# Patient Record
Sex: Female | Born: 1985
Health system: Southern US, Community
[De-identification: ages and names within clinical notes are randomized; demographics above are authoritative.]

## PROBLEM LIST (undated history)

## (undated) DIAGNOSIS — R51 Headache: Secondary | ICD-10-CM

## (undated) DIAGNOSIS — R109 Unspecified abdominal pain: Secondary | ICD-10-CM

## (undated) DIAGNOSIS — J45909 Unspecified asthma, uncomplicated: Secondary | ICD-10-CM

## (undated) DIAGNOSIS — R011 Cardiac murmur, unspecified: Secondary | ICD-10-CM

## (undated) DIAGNOSIS — R519 Headache, unspecified: Secondary | ICD-10-CM

## (undated) HISTORY — PX: TONSILLECTOMY: SUR1361

---
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
## 2006-10-25 ENCOUNTER — Ambulatory Visit: Payer: Self-pay | Admitting: Orthopedic Surgery

## 2007-02-15 ENCOUNTER — Emergency Department: Payer: Self-pay

## 2007-04-11 ENCOUNTER — Ambulatory Visit: Payer: Self-pay

## 2007-04-12 ENCOUNTER — Emergency Department: Payer: Self-pay | Admitting: Emergency Medicine

## 2007-04-25 ENCOUNTER — Ambulatory Visit: Payer: Self-pay | Admitting: Unknown Physician Specialty

## 2007-06-23 ENCOUNTER — Emergency Department: Payer: Self-pay | Admitting: Emergency Medicine

## 2007-06-26 ENCOUNTER — Emergency Department: Payer: Self-pay | Admitting: Emergency Medicine

## 2007-07-02 ENCOUNTER — Emergency Department: Payer: Self-pay | Admitting: Emergency Medicine

## 2007-08-14 ENCOUNTER — Emergency Department: Payer: Self-pay | Admitting: Emergency Medicine

## 2007-12-18 ENCOUNTER — Ambulatory Visit: Payer: Self-pay | Admitting: Unknown Physician Specialty

## 2007-12-20 ENCOUNTER — Emergency Department: Payer: Self-pay | Admitting: Unknown Physician Specialty

## 2008-04-19 ENCOUNTER — Ambulatory Visit: Payer: Self-pay | Admitting: Family Medicine

## 2008-06-17 ENCOUNTER — Emergency Department: Payer: Self-pay | Admitting: Emergency Medicine

## 2008-07-14 ENCOUNTER — Emergency Department: Payer: Self-pay | Admitting: Emergency Medicine

## 2008-11-11 ENCOUNTER — Observation Stay: Payer: Self-pay

## 2008-11-27 ENCOUNTER — Encounter: Payer: Self-pay | Admitting: Obstetrics and Gynecology

## 2008-11-27 ENCOUNTER — Ambulatory Visit: Payer: Self-pay | Admitting: Cardiology

## 2008-12-05 ENCOUNTER — Observation Stay: Payer: Self-pay | Admitting: Unknown Physician Specialty

## 2008-12-09 ENCOUNTER — Encounter: Payer: Self-pay | Admitting: Pediatric Cardiology

## 2009-01-12 ENCOUNTER — Observation Stay: Payer: Self-pay

## 2009-02-04 ENCOUNTER — Inpatient Hospital Stay: Payer: Self-pay

## 2009-05-11 ENCOUNTER — Emergency Department: Payer: Self-pay | Admitting: Emergency Medicine

## 2009-08-23 ENCOUNTER — Emergency Department: Payer: Self-pay | Admitting: Emergency Medicine

## 2010-08-31 ENCOUNTER — Emergency Department: Payer: Self-pay | Admitting: Emergency Medicine

## 2010-12-13 ENCOUNTER — Encounter: Payer: Self-pay | Admitting: Obstetrics and Gynecology

## 2011-01-25 ENCOUNTER — Encounter: Payer: Self-pay | Admitting: Pediatric Cardiology

## 2011-03-16 ENCOUNTER — Observation Stay: Payer: Self-pay

## 2011-04-27 ENCOUNTER — Observation Stay: Payer: Self-pay

## 2011-05-16 ENCOUNTER — Inpatient Hospital Stay: Payer: Self-pay

## 2011-06-03 ENCOUNTER — Ambulatory Visit: Payer: Self-pay

## 2012-06-15 ENCOUNTER — Ambulatory Visit: Payer: Self-pay | Admitting: Internal Medicine

## 2012-09-12 ENCOUNTER — Ambulatory Visit: Payer: Self-pay | Admitting: Family Medicine

## 2012-09-12 LAB — URINALYSIS, COMPLETE
Bilirubin,UR: NEGATIVE
Glucose,UR: NEGATIVE mg/dL (ref 0–75)
Leukocyte Esterase: NEGATIVE
Ph: 6 (ref 4.5–8.0)

## 2012-09-12 LAB — COMPREHENSIVE METABOLIC PANEL
Albumin: 4.1 g/dL (ref 3.4–5.0)
Anion Gap: 9 (ref 7–16)
BUN: 9 mg/dL (ref 7–18)
Bilirubin,Total: 0.4 mg/dL (ref 0.2–1.0)
Chloride: 105 mmol/L (ref 98–107)
Co2: 30 mmol/L (ref 21–32)
Creatinine: 1 mg/dL (ref 0.60–1.30)
EGFR (African American): >60
Glucose: 68 mg/dL (ref 65–99)
Osmolality: 284 (ref 275–301)
Potassium: 3.8 mmol/L (ref 3.5–5.1)
SGOT(AST): 14 U/L — ABNORMAL LOW (ref 15–37)
SGPT (ALT): 21 U/L (ref 12–78)
Sodium: 144 mmol/L (ref 136–145)
Total Protein: 7.5 g/dL (ref 6.4–8.2)

## 2012-09-12 LAB — CBC WITH DIFFERENTIAL/PLATELET
Basophil #: 0 10*3/uL (ref 0.0–0.1)
Basophil %: 0.4 %
Eosinophil #: 0.2 10*3/uL (ref 0.0–0.7)
Eosinophil %: 2.9 %
HCT: 42.7 % (ref 35.0–47.0)
HGB: 14.4 g/dL (ref 12.0–16.0)
Lymphocyte #: 2.5 10*3/uL (ref 1.0–3.6)
MCH: 30 pg (ref 26.0–34.0)
MCHC: 33.7 g/dL (ref 32.0–36.0)
MCV: 89 fL (ref 80–100)
Monocyte #: 0.3 x10 3/mm (ref 0.2–0.9)
Monocyte %: 4.6 %
Neutrophil %: 58 %
Platelet: 223 10*3/uL (ref 150–440)
RBC: 4.79 10*6/uL (ref 3.80–5.20)
RDW: 13.1 % (ref 11.5–14.5)
WBC: 7.4 10*3/uL (ref 3.6–11.0)

## 2012-09-12 LAB — LIPASE, BLOOD: Lipase: 232 U/L (ref 73–393)

## 2013-05-14 ENCOUNTER — Ambulatory Visit: Payer: Self-pay | Admitting: Emergency Medicine

## 2013-05-14 LAB — URINALYSIS, COMPLETE
Bilirubin,UR: NEGATIVE
Glucose,UR: NEGATIVE mg/dL (ref 0–75)
Ph: 6 (ref 4.5–8.0)
RBC,UR: NONE SEEN /HPF (ref 0–5)
Specific Gravity: 1.025 (ref 1.003–1.030)

## 2013-10-24 ENCOUNTER — Emergency Department: Payer: Self-pay | Admitting: Emergency Medicine

## 2013-10-24 LAB — CBC WITH DIFFERENTIAL/PLATELET
Basophil #: 0.1 10*3/uL (ref 0.0–0.1)
Basophil %: 0.6 %
EOS PCT: 1.6 %
Eosinophil #: 0.1 10*3/uL (ref 0.0–0.7)
HCT: 42.6 % (ref 35.0–47.0)
HGB: 14.3 g/dL (ref 12.0–16.0)
Lymphocyte #: 3.1 10*3/uL (ref 1.0–3.6)
Lymphocyte %: 39.1 %
MCH: 29.9 pg (ref 26.0–34.0)
MCHC: 33.4 g/dL (ref 32.0–36.0)
MCV: 90 fL (ref 80–100)
Monocyte #: 0.4 x10 3/mm (ref 0.2–0.9)
Monocyte %: 5 %
NEUTROS PCT: 53.7 %
Neutrophil #: 4.3 10*3/uL (ref 1.4–6.5)
Platelet: 243 10*3/uL (ref 150–440)
RBC: 4.76 10*6/uL (ref 3.80–5.20)
RDW: 12.6 % (ref 11.5–14.5)
WBC: 8.1 10*3/uL (ref 3.6–11.0)

## 2013-10-24 LAB — BASIC METABOLIC PANEL
Anion Gap: 3 — ABNORMAL LOW (ref 7–16)
BUN: 8 mg/dL (ref 7–18)
Calcium, Total: 8.6 mg/dL (ref 8.5–10.1)
Chloride: 108 mmol/L — ABNORMAL HIGH (ref 98–107)
Co2: 28 mmol/L (ref 21–32)
Creatinine: 0.6 mg/dL (ref 0.60–1.30)
EGFR (African American): >60
EGFR (Non-African Amer.): >60
Glucose: 94 mg/dL (ref 65–99)
OSMOLALITY: 276 (ref 275–301)
Potassium: 3.9 mmol/L (ref 3.5–5.1)
SODIUM: 139 mmol/L (ref 136–145)

## 2013-11-05 ENCOUNTER — Ambulatory Visit: Payer: Self-pay | Admitting: Neurology

## 2013-12-05 DIAGNOSIS — E538 Deficiency of other specified B group vitamins: Secondary | ICD-10-CM | POA: Insufficient documentation

## 2013-12-05 DIAGNOSIS — F329 Major depressive disorder, single episode, unspecified: Secondary | ICD-10-CM | POA: Insufficient documentation

## 2013-12-05 DIAGNOSIS — Z6841 Body Mass Index (BMI) 40.0 and over, adult: Secondary | ICD-10-CM | POA: Insufficient documentation

## 2013-12-05 DIAGNOSIS — E669 Obesity, unspecified: Secondary | ICD-10-CM | POA: Insufficient documentation

## 2013-12-05 DIAGNOSIS — F419 Anxiety disorder, unspecified: Secondary | ICD-10-CM | POA: Insufficient documentation

## 2013-12-05 DIAGNOSIS — F32A Depression, unspecified: Secondary | ICD-10-CM | POA: Insufficient documentation

## 2013-12-05 DIAGNOSIS — M542 Cervicalgia: Secondary | ICD-10-CM | POA: Insufficient documentation

## 2014-02-23 ENCOUNTER — Ambulatory Visit: Payer: Self-pay

## 2014-07-05 ENCOUNTER — Ambulatory Visit: Payer: Self-pay | Admitting: Physician Assistant

## 2014-09-07 ENCOUNTER — Ambulatory Visit: Payer: Self-pay | Admitting: Family Medicine

## 2016-01-17 ENCOUNTER — Ambulatory Visit
Admission: EM | Admit: 2016-01-17 | Discharge: 2016-01-17 | Disposition: A | Discharge status: Home / Self Care | Payer: Medicaid Other | Attending: Family Medicine | Admitting: Family Medicine

## 2016-01-17 DIAGNOSIS — H65192 Other acute nonsuppurative otitis media, left ear: Secondary | ICD-10-CM | POA: Diagnosis not present

## 2016-01-17 DIAGNOSIS — J069 Acute upper respiratory infection, unspecified: Secondary | ICD-10-CM

## 2016-01-17 MED ORDER — AMOXICILLIN 500 MG PO CAPS: 500.0000 mg | ORAL_CAPSULE | Freq: Two times a day (BID) | ORAL | Status: DC

## 2016-01-17 NOTE — ED Notes (Signed)
Patient complains of sore throat, fatigue, cough-non productive, ear pain and body aches x 1 week.

## 2016-01-17 NOTE — Discharge Instructions (Signed)
Patient can take oral antihistamine when necessary, cough suppressant when necessary, Tylenol/Motrin when necessary. Seek medical attention if symptoms persist or worsen.

## 2016-01-17 NOTE — ED Provider Notes (Signed)
CSN: [id number]     Arrival date & time 01/17/16  1534 History   First MD Initiated Contact with Patient 01/17/16 1617     Chief Complaint  Patient presents with  . Sore Throat   (Consider location/radiation/quality/duration/timing/severity/associated sxs/prior Treatment) HPI: Patient presents today with symptoms of sore throat, mild productive cough, left ear pain, myalgias for the last week. Patient denies any chest pain, shortness of breath, nausea, vomiting, diarrhea, abdominal pain. She denies smoking. She denies any asthma or COPD.  History reviewed. No pertinent past medical history. Past Surgical History  Procedure Laterality Date  . Tonsillectomy  age 30   Family History  Problem Relation Age of Onset  . Cardiomyopathy Father   . Migraines Mother    Social History  Substance Use Topics  . Smoking status: Never Smoker   . Smokeless tobacco: None  . Alcohol Use: 0.0 oz/week    0 Standard drinks or equivalent per week     Comment: rarely   OB History    No data available     Review of Systems: Negative except mentioned above.  Allergies  Azithromycin and Topamax  Home Medications   Prior to Admission medications   Medication Sig Start Date End Date Taking? Authorizing Provider  norethindrone-ethinyl estradiol (JUNEL FE,GILDESS FE,LOESTRIN FE) 1-20 MG-MCG tablet Take 1 tablet by mouth daily.   Yes Historical Provider, MD  amoxicillin (AMOXIL) 500 MG capsule Take 1 capsule (500 mg total) by mouth 2 (two) times daily. 01/17/16   Jolene ProvostKirtida Aiyden Lauderback, MD   Meds Ordered and Administered this Visit  Medications - No data to display  BP 128/84 mmHg  Pulse 85  Temp(Src) 98.4 F (36.9 C) (Oral)  Resp 18  Ht 5\' 2"  (1.575 m)  Wt 230 lb (104.327 kg)  BMI 42.06 kg/m2  SpO2 98%  LMP 01/03/2016 No data found.   Physical Exam:  GENERAL: NAD HEENT: mild pharyngeal erythema, no exudate, moderate erythema of left TM, no bulging or discharge from ear, normal right TM, no  cervical LAD RESP: CTA B CARD: RRR NEURO: CN II-XII grossly intact   ED Course  Procedures (including critical care time)  Labs Review Labs Reviewed - No data to display  Imaging Review No results found.    MDM   1. Other acute nonsuppurative otitis media of left ear   2. URI, acute   Patient given prescription for Amoxicillin, Claritin when necessary, Sudafed when necessary, Delsym when necessary, rest, hydration, Tylenol/Motrin when necessary, seek medical attention if symptoms persist or worsen as discussed.    Jolene ProvostKirtida Adina Puzzo, MD 01/17/16 1630

## 2016-08-31 DIAGNOSIS — M5416 Radiculopathy, lumbar region: Secondary | ICD-10-CM | POA: Insufficient documentation

## 2016-10-24 ENCOUNTER — Emergency Department
Admission: EM | Admit: 2016-10-24 | Discharge: 2016-10-24 | Disposition: A | Discharge status: Home / Self Care | Payer: Medicaid Other | Attending: Emergency Medicine | Admitting: Emergency Medicine

## 2016-10-24 ENCOUNTER — Encounter: Payer: Self-pay | Admitting: *Deleted

## 2016-10-24 DIAGNOSIS — M5431 Sciatica, right side: Secondary | ICD-10-CM | POA: Insufficient documentation

## 2016-10-24 LAB — URINALYSIS, COMPLETE (UACMP) WITH MICROSCOPIC
BACTERIA UA: NONE SEEN
BILIRUBIN URINE: NEGATIVE
Glucose, UA: NEGATIVE mg/dL
HGB URINE DIPSTICK: NEGATIVE
Ketones, ur: NEGATIVE mg/dL
Leukocytes, UA: NEGATIVE
NITRITE: NEGATIVE
PROTEIN: NEGATIVE mg/dL
Specific Gravity, Urine: 1.019 (ref 1.005–1.030)
pH: 6 (ref 5.0–8.0)

## 2016-10-24 LAB — POCT PREGNANCY, URINE: PREG TEST UR: NEGATIVE

## 2016-10-24 MED ORDER — PREDNISONE 10 MG PO TABS: ORAL_TABLET | ORAL | 0 refills | Status: DC

## 2016-10-24 NOTE — ED Notes (Signed)
See triage note  States she has had lower back pain for several weeks  States pain radiates into right leg  Denies any recent trauma.Marland Kitchenambulates with slight limp

## 2016-10-24 NOTE — ED Triage Notes (Signed)
States lower back pain that goes down her right leg, states pain since February 3rd, states she has been seen at [facility] and [facility] with no relief, denies any urinary symptoms

## 2016-10-24 NOTE — ED Provider Notes (Signed)
[facility] Emergency Department Provider Note   ____________________________________________   None    (approximate)  I have reviewed the triage vital signs and the nursing notes.   HISTORY  Chief Complaint Back Pain   HPI Torri Karin Lieu is a 31 y.o. female for a complaint of low back pain that radiates down her right leg. Patient states that she has had pain since February 3. She has been to both [facility] and [facility]  for examination of her back and has not had any relief. Patient denies any urinary symptoms, incontinence of bowel or bladder, no saddle anesthesias. Patient continues to ambulate without assistance. Patient denies any injury to her back. Patient was seen at [facility] and reportedly referred to the "spine doctor" and a "chiropractor". She states she is not currently taking any medication but was given Toradol injection without any relief. She states that her back was x-rayed at [facility] in February and she was told that this was normal. She does admit on this visit that she has had some frequent urination and a history of UTIs in the past. She denies any fever or chills. There's been no nausea or vomiting. At this time she rates her pain as a 10 over 10.   History reviewed. No pertinent past medical history.  There are no active problems to display for this patient.   Past Surgical History:  Procedure Laterality Date  . TONSILLECTOMY  age 49    Prior to Admission medications   Medication Sig Start Date End Date Taking? Authorizing Provider  norethindrone-ethinyl estradiol (JUNEL FE,GILDESS FE,LOESTRIN FE) 1-20 MG-MCG tablet Take 1 tablet by mouth daily.    Historical Provider, MD  predniSONE (DELTASONE) 10 MG tablet Take 6 tablets  today, on day 2 take 5 tablets, day 3 take 4 tablets, day 4 take 3 tablets, day 5 take  2 tablets and 1 tablet the last day 10/24/16   Tommi Rumps, PA-C    Allergies Azithromycin and Topamax [topiramate]  Family  History  Problem Relation Age of Onset  . Cardiomyopathy Father   . Migraines Mother     Social History Social History  Substance Use Topics  . Smoking status: Never Smoker  . Smokeless tobacco: Not on file  . Alcohol use 0.0 oz/week     Comment: rarely    Review of Systems Constitutional: No fever/chills Cardiovascular: Denies chest pain. Respiratory: Denies shortness of breath. Gastrointestinal: No abdominal pain.  No nausea, no vomiting.  Genitourinary: Positive for frequency. Musculoskeletal: Positive for low back pain. Positive for radiculopathy right leg. Skin: Negative for rash. Neurological: Negative for  focal weakness or numbness.  10-point ROS otherwise negative.  ____________________________________________   PHYSICAL EXAM:  VITAL SIGNS: ED Triage Vitals  Enc Vitals Group     BP 10/24/16 0939 130/89     Pulse Rate 10/24/16 0939 81     Resp 10/24/16 0939 18     Temp 10/24/16 0939 98.9 F (37.2 C)     Temp Source 10/24/16 0939 Oral     SpO2 10/24/16 0939 100 %     Weight 10/24/16 0940 210 lb (95.3 kg)     Height 10/24/16 0940  (1.575 m)     Head Circumference --      Peak Flow --      Pain Score 10/24/16 0941 10     Pain Loc --      Pain Edu? --      Excl. in GC? --  Constitutional: Alert and oriented. Well appearing and in no acute distress. Eyes: Conjunctivae are normal. PERRL. EOMI. Head: Atraumatic. Nose: No congestion/rhinnorhea. Neck: No stridor.   Cardiovascular: Normal rate, regular rhythm. Grossly normal heart sounds.  Good peripheral circulation. Respiratory: Normal respiratory effort.  No retractions. Lungs CTAB. Gastrointestinal: Soft and nontender. No distention.  No CVA tenderness. Musculoskeletal:Examination of the back there is no gross deformity. There is minimal tenderness on palpation of the vertebral bodies lumbar spine. There is however moderate tenderness on palpation of the right paravertebral muscles and SI joint  area. Range of motion is slightly restricted secondary to patient's pain. Straight leg raises were negative. Reflexes were 1+ bilaterally. Good strength noted bilaterally. Neurologic:  Normal speech and language. No gross focal neurologic deficits are appreciated. No gait instability. Skin:  Skin is warm, dry and intact. No rash noted. Psychiatric: Mood and affect are normal. Speech and behavior are normal.  ____________________________________________   LABS (all labs ordered are listed, but only abnormal results are displayed)  Labs Reviewed  URINALYSIS, COMPLETE (UACMP) WITH MICROSCOPIC - Abnormal; Notable for the following:       Result Value   Color, Urine YELLOW (*)    APPearance HAZY (*)    Squamous Epithelial / LPF 6-30 (*)    All other components within normal limits  POC URINE PREG, ED  POCT PREGNANCY, URINE    PROCEDURES  Procedure(s) performed: None  Procedures  Critical Care performed: No  ____________________________________________   INITIAL IMPRESSION / ASSESSMENT AND PLAN / ED COURSE  Pertinent labs & imaging results that were available during my care of the patient were reviewed by me and considered in my medical decision making (see chart for details).  Urinalysis was obtained and was negative for UTI. Records were reviewed and lumbar spine x-ray was done at [facility] February 2018. Patient is encouraged to call her doctor at [facility] for further examination of her back and evaluation. She was given a prednisone 6 day taper to begin today. She is encouraged to use ice or heat to her back as needed for comfort.      ____________________________________________   FINAL CLINICAL IMPRESSION(S) / ED DIAGNOSES  Final diagnoses:  Sciatica of right side      NEW MEDICATIONS STARTED DURING THIS VISIT:  Discharge Medication List as of 10/24/2016  1:03 PM    START taking these medications   Details  predniSONE (DELTASONE) 10 MG tablet Take 6 tablets  today, on  day 2 take 5 tablets, day 3 take 4 tablets, day 4 take 3 tablets, day 5 take  2 tablets and 1 tablet the last day, Print         Note:  This document was prepared using Dragon voice recognition software and may include unintentional dictation errors.    Tommi Rumps, PA-C 10/24/16 1946    Jene Every, MD 10/25/16 [phone]

## 2016-10-24 NOTE — Discharge Instructions (Signed)
Follow-up with your doctor at [facility]. Call today to let them know the you not doing better. Begin taking prednisone as directed. Alternate between ice and heat to your  back.

## 2017-01-26 ENCOUNTER — Emergency Department
Admission: EM | Admit: 2017-01-26 | Discharge: 2017-01-26 | Disposition: A | Discharge status: Home / Self Care | Payer: Self-pay | Attending: Emergency Medicine | Admitting: Emergency Medicine

## 2017-01-26 ENCOUNTER — Encounter: Payer: Self-pay | Admitting: Emergency Medicine

## 2017-01-26 ENCOUNTER — Emergency Department: Payer: Self-pay

## 2017-01-26 DIAGNOSIS — M546 Pain in thoracic spine: Secondary | ICD-10-CM | POA: Insufficient documentation

## 2017-01-26 DIAGNOSIS — M549 Dorsalgia, unspecified: Secondary | ICD-10-CM

## 2017-01-26 MED ORDER — IBUPROFEN 600 MG PO TABS: 600.0000 mg | ORAL_TABLET | Freq: Four times a day (QID) | ORAL | 0 refills | Status: DC | PRN

## 2017-01-26 MED ORDER — CYCLOBENZAPRINE HCL 5 MG PO TABS: 5.0000 mg | ORAL_TABLET | Freq: Three times a day (TID) | ORAL | 0 refills | Status: AC | PRN
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
## 2017-08-29 NOTE — H&P (Signed)
[name] is a 32 y.o. female here for LAVH and bilateral salpingectomy and possible unilateral oophorectomy . [name] [name] a 32 y.o.femalewas referred by Referred by Dr Scherrie Bateman pain G2P2 ( svdx2).pt with a 2.5 yr h/o of bilateral lower pelvic pain that's comes on for 2 weeks at a time . Pain is sharp and stabbing The 2 weeks would revolve 1 week before cycle and during her cycle . She has been on continuous OCPs and now DepoProvera ( no menses) and no difference in pain . + dyspareunia during these painful times. + mother and GM  with endometriosis. GM maternal with breast cancer also    patient has a h/o migraines without aura and OCPs have not worsened her h/a in past   TVUS 08/25/17  Study Result   Result status: In process  Ut anteverted  Fibroid seen: post to endo=0.93cm  Endo=4.33mm  No FF seen in CDS's  LOV appears wnl  ROV simple cyst=1.4cm            Past Medical History:  has a past medical history of Abnormal Pap smear, Asthma without status asthmaticus, unspecified, Cardiac murmur, unspecified, Cervicalgia, Depression, unspecified, Exertional dyspnea, Former tobacco use, IBS (irritable bowel syndrome), and Migraine.  Past Surgical History:  has a past surgical history that includes Tonsillectomy and Knee arthroscopy (2004). Family History: family history includes Asthma in her sister; Breast cancer in her maternal grandmother; Cardiomyopathy (Abnormal function of the heart muscle) in her father; Coronary Artery Disease (Blocked arteries around heart) in her father; Depression in her mother; Diabetes type II in her paternal aunt and paternal grandmother. Social History:  reports that she quit smoking about 4 years ago. Her smoking use included cigarettes. She has a 3.00 pack-year smoking history. She has never used smokeless tobacco. She reports that she drinks about 0.6 - 1.8 oz of alcohol per week. She reports that she  does not use drugs. OB/GYN History:          OB History    Gravida  2   Para  2   Term      Preterm      AB      Living  2     SAB      TAB      Ectopic      Molar      Multiple      Live Births  2          Allergies: is allergic to topamax [topiramate] and zithromax [azithromycin]. Medications:  Current Outpatient Medications:  .  butalbital-acetaminophen-caff 50-300-40 mg Cap, Take by mouth every 6 (six) hours as needed., Disp: , Rfl:  .  medroxyPROGESTERone (DEPO-PROVERA) 150 mg/mL injection, Inject 150 mg into the muscle every 3 (three) months, Disp: , Rfl:   Review of Systems: General:                      No fatigue or weight loss Eyes:                           No vision changes Ears:                            No hearing difficulty Respiratory:                No cough or shortness of breath Pulmonary:  No asthma or shortness of breath Cardiovascular:           No chest pain, palpitations, dyspnea on exertion Gastrointestinal:          No abdominal bloating, chronic diarrhea, constipations, masses, pain or hematochezia Genitourinary:             No hematuria, dysuria, abnormal vaginal discharge, pelvic pain, Menometrorrhagia Lymphatic:                   No swollen lymph nodes Musculoskeletal:         No muscle weakness Neurologic:                  No extremity weakness, syncope, seizure disorder Psychiatric:                  No history of depression, delusions or suicidal/homicidal ideation    Exam:      Vitals:   08/25/17 1509  BP: 131/89  Pulse: 95    Body mass index is 43.9 kg/m.  WDWN white/  female in NAD   Lungs: CTA  CV : RRR without murmur   Breast: exam done in sitting and lying position : No dimpling or retraction, no dominant mass, no spontaneous discharge, no axillary adenopathy Neck:  no thyromegaly Abdomen: soft , no mass, normal active bowel sounds,  non-tender, no rebound tenderness Pelvic:  tanner stage 5 ,  External genitalia: vulva /labia no lesions Urethra: no prolapse Vagina: normal physiologic d/c, adequate room for LAVH  Cervix: no lesions, no cervical motion tenderness   Uterus: normal size shape and contour, non-tender Adnexa: no mass,  non-tender   Rectovaginal:   Impression:   The primary encounter diagnosis was Pelvic pain in female. A diagnosis of Dyspareunia, female was also pertinent to this visit.  Pain is c/w endometriosis given the cyclic nature and her fhx of endometriosis  Plan:  I have spoken with the patient regarding treatment options including expectant management, hormonal options, or surgical intervention.ie .Marland Kitchen. Diagnostic L/S or definitive LAVH+ bilateral salpingectomy and possible unilateral oophorectomy if endometriosis is found and / or involves an ovary . After a full discussion the pt elects to proceed with the latter  25 minutes spent with the patient and > 50 % of time spent in direct counseling  pt has been counseled regarding the risks of the procedure      Vilma PraderHOMAS JANSE SCHERMERHORN, MD

## 2017-08-31 ENCOUNTER — Encounter
Admission: RE | Admit: 2017-08-31 | Discharge: 2017-08-31 | Disposition: A | Discharge status: Home / Self Care | Payer: Medicaid Other | Source: Ambulatory Visit | Attending: Obstetrics and Gynecology | Admitting: Obstetrics and Gynecology

## 2017-08-31 ENCOUNTER — Ambulatory Visit
Admission: EM | Admit: 2017-08-31 | Discharge: 2017-08-31 | Disposition: A | Discharge status: Home / Self Care | Payer: Medicaid Other | Attending: Emergency Medicine | Admitting: Emergency Medicine

## 2017-08-31 ENCOUNTER — Other Ambulatory Visit: Payer: Self-pay

## 2017-08-31 DIAGNOSIS — R0981 Nasal congestion: Secondary | ICD-10-CM | POA: Diagnosis not present

## 2017-08-31 DIAGNOSIS — J029 Acute pharyngitis, unspecified: Secondary | ICD-10-CM

## 2017-08-31 DIAGNOSIS — H6693 Otitis media, unspecified, bilateral: Secondary | ICD-10-CM

## 2017-08-31 HISTORY — DX: Cardiac murmur, unspecified: R01.1

## 2017-08-31 HISTORY — DX: Headache: R51

## 2017-08-31 HISTORY — DX: Headache, unspecified: R51.9

## 2017-08-31 HISTORY — DX: Unspecified asthma, uncomplicated: J45.909

## 2017-08-31 LAB — RAPID STREP SCREEN (MED CTR MEBANE ONLY): Streptococcus, Group A Screen (Direct): NEGATIVE

## 2017-08-31 LAB — RAPID INFLUENZA A&B ANTIGENS
Influenza A (ARMC): NEGATIVE
Influenza B (ARMC): NEGATIVE

## 2017-08-31 MED ORDER — AMOXICILLIN 500 MG PO CAPS: 500.0000 mg | ORAL_CAPSULE | Freq: Two times a day (BID) | ORAL | 0 refills | Status: DC

## 2017-08-31 MED ORDER — CETIRIZINE HCL 10 MG PO TABS: 10.0000 mg | ORAL_TABLET | Freq: Every day | ORAL | 2 refills | Status: DC

## 2017-08-31 NOTE — Discharge Instructions (Signed)
-  Amoxicillin: one tablet twice a day for 10 days -Zyrtec: one tablet daily -ibuprofen or Tylenol for pain and fever -push fluids -strep and flu swabs are negative. Strep will be sent for culture -touch base with OBGYN regarding

## 2017-08-31 NOTE — Patient Instructions (Signed)
Your procedure is scheduled on: Monday, MARCH 4  Report to THE SECOND FLOOR OF THE MEDICAL MALL  To find out your arrival time please call [phone] between 1PM - 3PM on Friday, MARCH 1  Remember: Instructions that are not followed completely may result in serious  medical risk, up to and including death, or upon the discretion of your surgeon  and anesthesiologist your surgery may need to be rescheduled.     _X__ 1. Do not eat food after midnight the night before your procedure.                 No gum chewing, lozengers or hard candies.                  You may drink clear liquids up to 2 hours                 before you are scheduled to arrive for your surgery-                  DO not drink clear liquids within 2 hours of the start of your surgery.                  Clear Liquids include:  water, apple juice without pulp, clear carbohydrate                 drink such as Clearfast of Gartorade, Black Coffee or Tea (Do not add                 anything to coffee or tea).  __X__2.  On the morning of surgery brush your teeth with toothpaste and water,                       you may rinse your mouth with mouthwash if you wish.                              Do not swallow any toothpaste of mouthwash.     _X__ 3.  No Alcohol for 24 hours before or after surgery.   _X__ 4.  Do Not Smoke or use e-cigarettes For 24 Hours Prior to Your Surgery.                 Do not use any chewable tobacco products for at least 6 hours prior to                 surgery.  ____  5.  Bring all medications with you on the day of surgery if instructed.   __x__  6.  Notify your doctor if there is any change in your medical condition      (cold, fever, infections). IF YOUR EAR INFECTION HAS NOT IMPROVED                  OR YOU ARE STILL SICK, PLEASE CALL DR. SCHERMERHORN                     AND LET HIM KNOW.     Do not wear jewelry, make-up, hairpins, clips or nail polish. Do not wear  lotions, powders, or perfumes. You may wear deodorant. Do not shave 48 hours prior to surgery. Men may shave face and neck. Do not bring valuables to the hospital.    [facility] any belongings or valuables.  Contacts,  dentures or bridgework may not be worn into surgery. Leave your suitcase in the car. After surgery it may be brought to your room. For patients admitted to the hospital, discharge time is determined by your treatment team.   Patients discharged the day of surgery will not be allowed to drive home.   Please read over the following fact sheets that you were given:   INSTRUCTIONS PROVIDED OVER THE PHONE                     MEDICAL DIRECTIVE INFORMATION SHEET   ____ Take these medicines the morning of surgery with A SIP OF WATER:    1.NONE  2.   3.   4.    _X___ Fleet Enema (as directed) USE ONE HOUR BEFORE ARRIVING FOR SURGERY   __X__ Use CHG Soap as directed. INSTRUCTIONS PROVIDED  ____ Use inhalers on the day of surgery  ____ Stop ALL ASPIRIN PRODUCTS AS OF 09/04/17              THIS INCLUDES EXCEDRIN / GOODYS POWDER / BC POWDER  ____ Stop Anti-inflammatories AS OF 09/04/17               THIS INCLUDES IBUPROFEN / MOTRIN / ADVIL / ALEVE   __X__ Stop supplements until after surgery.  ALL SUPPLEMENTS STOP 09/04/17  ____ Bring C-Pap to the hospital.   HAVE STOOL SOFTENERS AVAILABLE ONCE HOME  WEAR LOOSE FITTING CLOTHING TO THE HOSPITAL.  NO CONTACTS.

## 2017-08-31 NOTE — ED Triage Notes (Signed)
Patient complains of sore throat that started yesterday morning. Patient reports that she has body aches and fatigue.

## 2017-08-31 NOTE — ED Provider Notes (Signed)
[facility]    CSN: [id number] Arrival date & time: 08/31/17  [id number]     History   Chief Complaint Chief Complaint  Patient presents with  . Sore Throat    HPI [name] [name] is a 32 y.o. female.   Patient is a 32 year old female who presents with complaint of sore throat that started yesterday morning and states this morning she is also experiencing some ear pain, congestion, and sinus pressure with some clear drainage.  Patient does report body aches, but no fever or chills.  She states that her daughter is on day 9 of antibiotics for strep throat.  Additionally patient Her nephew over the weekend and he was just diagnosed with strep throat and flu.  Patient denies any nausea.  She is not taking any over-the-counter medications for this.  She is not taking antihistamines.  Patient reports she is scheduled for hysterectomy in 2 weeks for endometriosis.  Patient denies any chest pain or shortness of breath.  She does have some abdominal discomfort that she attributes to her endometriosis.  Patient reports that she gets a rash with azithromycin as well as Topamax.      History reviewed. No pertinent past medical history.  There are no active problems to display for this patient.   Past Surgical History:  Procedure Laterality Date  . TONSILLECTOMY  age 32    OB History    No data available       Home Medications    Prior to Admission medications   Medication Sig Start Date End Date Taking? Authorizing Provider  aspirin-acetaminophen-caffeine (EXCEDRIN MIGRAINE) 385 172 [id number] MG tablet Take 2 tablets by mouth daily as needed for headache.   Yes [provider]  Carboxymethylcellul-Glycerin (LUBRICATING EYE DROPS OP) Apply 1 drop to eye daily as needed (dry eyes).   Yes [provider]  loperamide (IMODIUM) 2 MG capsule Take 2 mg by mouth as needed for diarrhea or loose stools.   Yes [provider]  medroxyPROGESTERone  (DEPO-PROVERA) 150 MG/ML injection Inject 150 mg into the muscle every 3 (three) months.   Yes [provider]  amoxicillin (AMOXIL) 500 MG capsule Take 1 capsule (500 mg total) by mouth 2 (two) times daily. 08/31/17   Candis SchatzHarris, Bethanne Mule D, PA-C  cetirizine (ZYRTEC) 10 MG tablet Take 1 tablet (10 mg total) by mouth daily. 08/31/17   Candis SchatzHarris, Delvis Kau D, PA-C    Family History Family History  Problem Relation Age of Onset  . Cardiomyopathy Father   . Migraines Mother     Social History Social History   Tobacco Use  . Smoking status: Never Smoker  . Smokeless tobacco: Never Used  Substance Use Topics  . Alcohol use: Yes    Alcohol/week: 0.0 oz    Comment: rarely  . Drug use: No     Allergies   Azithromycin and Topamax [topiramate]   Review of Systems Review of Systems  As noted above in HPI.  Other systems reviewed and found to be negative   Physical Exam Triage Vital Signs ED Triage Vitals  Enc Vitals Group     BP 08/31/17 0957 126/86     Pulse Rate 08/31/17 0957 65     Resp 08/31/17 0957 18     Temp 08/31/17 0957 98.4 F (36.9 C)     Temp Source 08/31/17 0957 Oral     SpO2 08/31/17 0957 98 %     Weight 08/31/17 0955 235 lb (106.6 kg)  Height 08/31/17 0955 5\' 2"  (1.575 m)     Head Circumference --      Peak Flow --      Pain Score 08/31/17 0955 3     Pain Loc --      Pain Edu? --      Excl. in GC? --    No data found.  Updated Vital Signs BP 126/86 (BP Location: Left Arm)   Pulse 65   Temp 98.4 F (36.9 C) (Oral)   Resp 18   Ht 5\' 2"  (1.575 m)   Wt 235 lb (106.6 kg)   SpO2 98%   BMI 42.98 kg/m    Physical Exam  Constitutional: She is oriented to person, place, and time. She appears well-developed and well-nourished. She does not appear ill.  HENT:  Head: Atraumatic.  Right Ear: Ear canal normal. A middle ear effusion (cloudy fluid) is present.  Left Ear: Ear canal normal. A middle ear effusion (cloudy fluid) is present.  Mouth/Throat:  Uvula is midline, oropharynx is clear and moist and mucous membranes are normal. No posterior oropharyngeal erythema. Tonsils are 0 on the right. Tonsils are 0 on the left. No tonsillar exudate.  Eyes: EOM are normal. Pupils are equal, round, and reactive to light.  Neck: Normal range of motion. Neck supple.  Cardiovascular: Normal rate, regular rhythm and normal heart sounds.  No murmur heard. Pulmonary/Chest: Effort normal and breath sounds normal. No respiratory distress. She has no wheezes.  Neurological: She is alert and oriented to person, place, and time.  Skin: Skin is warm and dry.  Psychiatric: She has a normal mood and affect. Her behavior is normal.     UC Treatments / Results  Labs (all labs ordered are listed, but only abnormal results are displayed) Labs Reviewed  RAPID STREP SCREEN (NOT AT [facility])  RAPID INFLUENZA A&B ANTIGENS ([facility] ONLY)  CULTURE, GROUP A STREP [facility])    EKG  EKG Interpretation None       Radiology No results found.  Procedures Procedures (including critical care time)  Medications Ordered in UC Medications - No data to display   Initial Impression / Assessment and Plan / UC Course  I have reviewed the triage vital signs and the nursing notes.  Pertinent labs & imaging results that were available during my care of the patient were reviewed by me and considered in my medical decision making (see chart for details).    Patient with upper restaurant symptoms as well as body aches that began last night.  Patient with contacts at home with both strep throat and flu.  Final Clinical Impressions(s) / UC Diagnoses   Final diagnoses:  Bilateral otitis media, unspecified otitis media type  Nasal congestion  Sore throat    ED Discharge Orders        Ordered    amoxicillin (AMOXIL) 500 MG capsule  2 times daily     08/31/17 1053    cetirizine (ZYRTEC) 10 MG tablet  Daily     08/31/17 1053     Strep and flu swabs were negative.  We will  send the strep for culture.  We will give her prescription for Zyrtec for her symptoms.  Also go ahead and give her prescription for amoxicillin for her otitis media given her impending surgery.  Advised patient to follow-up with her OB GYN/surgeon   Controlled Substance Prescriptions [place] Controlled Substance Registry consulted? Not Applicable   Candis Schatz, PA-C 08/31/17 1056

## 2017-09-03 LAB — CULTURE, GROUP A STREP (THRC)

## 2017-09-05 ENCOUNTER — Encounter
Admission: RE | Admit: 2017-09-05 | Discharge: 2017-09-05 | Disposition: A | Discharge status: Home / Self Care | Payer: Medicaid Other | Source: Ambulatory Visit | Attending: Obstetrics and Gynecology | Admitting: Obstetrics and Gynecology

## 2017-09-05 DIAGNOSIS — Z01812 Encounter for preprocedural laboratory examination: Secondary | ICD-10-CM | POA: Insufficient documentation

## 2017-09-05 DIAGNOSIS — Z01811 Encounter for preprocedural respiratory examination: Secondary | ICD-10-CM | POA: Diagnosis present

## 2017-09-05 LAB — TYPE AND SCREEN
ABO/RH(D): O POS
Antibody Screen: NEGATIVE

## 2017-09-05 LAB — BASIC METABOLIC PANEL
Anion gap: 10 (ref 5–15)
BUN: 8 mg/dL (ref 6–20)
CALCIUM: 9.3 mg/dL (ref 8.9–10.3)
CHLORIDE: 110 mmol/L (ref 101–111)
CO2: 22 mmol/L (ref 22–32)
CREATININE: 0.84 mg/dL (ref 0.44–1.00)
GFR calc non Af Amer: >60 mL/min (ref >60)
Glucose, Bld: 89 mg/dL (ref 65–99)
Potassium: 3.8 mmol/L (ref 3.5–5.1)
SODIUM: 142 mmol/L (ref 135–145)

## 2017-09-05 LAB — CBC
HCT: 42.4 % (ref 35.0–47.0)
Hemoglobin: 14 g/dL (ref 12.0–16.0)
MCH: 28.7 pg (ref 26.0–34.0)
MCHC: 32.9 g/dL (ref 32.0–36.0)
MCV: 87.2 fL (ref 80.0–100.0)
PLATELETS: 287 10*3/uL (ref 150–440)
RBC: 4.86 MIL/uL (ref 3.80–5.20)
RDW: 12.8 % (ref 11.5–14.5)
WBC: 5.7 10*3/uL (ref 3.6–11.0)

## 2017-09-11 ENCOUNTER — Other Ambulatory Visit: Payer: Self-pay

## 2017-09-11 ENCOUNTER — Observation Stay
Admission: RE | Admit: 2017-09-11 | Discharge: 2017-09-11 | Disposition: A | Discharge status: Home / Self Care | Payer: Medicaid Other | Source: Ambulatory Visit | Attending: Obstetrics and Gynecology | Admitting: Obstetrics and Gynecology

## 2017-09-11 ENCOUNTER — Ambulatory Visit: Payer: Medicaid Other | Admitting: Anesthesiology

## 2017-09-11 ENCOUNTER — Encounter: Payer: Self-pay | Admitting: *Deleted

## 2017-09-11 ENCOUNTER — Encounter
Admission: RE | Disposition: A | Discharge status: Home / Self Care | Payer: Self-pay | Source: Ambulatory Visit | Attending: Obstetrics and Gynecology

## 2017-09-11 DIAGNOSIS — Z793 Long term (current) use of hormonal contraceptives: Secondary | ICD-10-CM | POA: Diagnosis not present

## 2017-09-11 DIAGNOSIS — Z842 Family history of other diseases of the genitourinary system: Secondary | ICD-10-CM | POA: Diagnosis not present

## 2017-09-11 DIAGNOSIS — N800 Endometriosis of uterus: Principal | ICD-10-CM | POA: Insufficient documentation

## 2017-09-11 DIAGNOSIS — Z888 Allergy status to other drugs, medicaments and biological substances status: Secondary | ICD-10-CM | POA: Insufficient documentation

## 2017-09-11 DIAGNOSIS — Z79899 Other long term (current) drug therapy: Secondary | ICD-10-CM | POA: Insufficient documentation

## 2017-09-11 DIAGNOSIS — G8929 Other chronic pain: Secondary | ICD-10-CM | POA: Diagnosis present

## 2017-09-11 DIAGNOSIS — R102 Pelvic and perineal pain: Secondary | ICD-10-CM | POA: Diagnosis present

## 2017-09-11 DIAGNOSIS — Z7982 Long term (current) use of aspirin: Secondary | ICD-10-CM | POA: Diagnosis not present

## 2017-09-11 DIAGNOSIS — G43909 Migraine, unspecified, not intractable, without status migrainosus: Secondary | ICD-10-CM | POA: Insufficient documentation

## 2017-09-11 DIAGNOSIS — N803 Endometriosis of pelvic peritoneum: Secondary | ICD-10-CM | POA: Diagnosis not present

## 2017-09-11 DIAGNOSIS — J45909 Unspecified asthma, uncomplicated: Secondary | ICD-10-CM | POA: Insufficient documentation

## 2017-09-11 DIAGNOSIS — Z881 Allergy status to other antibiotic agents status: Secondary | ICD-10-CM | POA: Insufficient documentation

## 2017-09-11 DIAGNOSIS — Z87891 Personal history of nicotine dependence: Secondary | ICD-10-CM | POA: Insufficient documentation

## 2017-09-11 DIAGNOSIS — Z9889 Other specified postprocedural states: Secondary | ICD-10-CM

## 2017-09-11 HISTORY — PX: LAPAROSCOPIC VAGINAL HYSTERECTOMY WITH SALPINGECTOMY: SHX6680

## 2017-09-11 LAB — CBC
HCT: 42.3 % (ref 35.0–47.0)
HEMOGLOBIN: 13.9 g/dL (ref 12.0–16.0)
MCH: 28.4 pg (ref 26.0–34.0)
MCHC: 32.9 g/dL (ref 32.0–36.0)
MCV: 86.3 fL (ref 80.0–100.0)
PLATELETS: 281 10*3/uL (ref 150–440)
RBC: 4.9 MIL/uL (ref 3.80–5.20)
RDW: 13.1 % (ref 11.5–14.5)
WBC: 11.3 10*3/uL — ABNORMAL HIGH (ref 3.6–11.0)

## 2017-09-11 LAB — BASIC METABOLIC PANEL
Anion gap: 13 (ref 5–15)
BUN: 10 mg/dL (ref 6–20)
CHLORIDE: 108 mmol/L (ref 101–111)
CO2: 15 mmol/L — ABNORMAL LOW (ref 22–32)
CREATININE: 0.91 mg/dL (ref 0.44–1.00)
Calcium: 8.9 mg/dL (ref 8.9–10.3)
Glucose, Bld: 206 mg/dL — ABNORMAL HIGH (ref 65–99)
Potassium: 3.8 mmol/L (ref 3.5–5.1)
SODIUM: 136 mmol/L (ref 135–145)

## 2017-09-11 LAB — POCT PREGNANCY, URINE: Preg Test, Ur: NEGATIVE

## 2017-09-11 LAB — ABO/RH: ABO/RH(D): O POS

## 2017-09-11 SURGERY — HYSTERECTOMY, VAGINAL, LAPAROSCOPY-ASSISTED, WITH SALPINGECTOMY
Anesthesia: General | Wound class: Clean Contaminated

## 2017-09-11 MED ORDER — FENTANYL CITRATE (PF) 100 MCG/2ML IJ SOLN
50.0000 ug | Freq: Once | INTRAMUSCULAR | Status: AC
  Administered 2017-09-11: 50 ug via INTRAVENOUS

## 2017-09-11 MED ORDER — MORPHINE SULFATE (PF) 2 MG/ML IV SOLN
1.0000 mg | INTRAVENOUS | Status: DC | PRN
  Administered 2017-09-11: 2 mg via INTRAVENOUS
  Filled 2017-09-11: qty 1

## 2017-09-11 MED ORDER — FAMOTIDINE 20 MG PO TABS: 20.0000 mg | ORAL_TABLET | Freq: Once | ORAL | Status: DC

## 2017-09-11 MED ORDER — ROCURONIUM BROMIDE 50 MG/5ML IV SOLN
INTRAVENOUS | Status: AC
  Filled 2017-09-11: qty 1

## 2017-09-11 MED ORDER — LIDOCAINE-EPINEPHRINE 1 %-1:100000 IJ SOLN
INTRAMUSCULAR | Status: AC
  Filled 2017-09-11: qty 1

## 2017-09-11 MED ORDER — DEXAMETHASONE SODIUM PHOSPHATE 10 MG/ML IJ SOLN
INTRAMUSCULAR | Status: DC | PRN
  Administered 2017-09-11: 10 mg via INTRAVENOUS

## 2017-09-11 MED ORDER — FENTANYL CITRATE (PF) 100 MCG/2ML IJ SOLN
INTRAMUSCULAR | Status: AC
  Filled 2017-09-11: qty 2

## 2017-09-11 MED ORDER — OXYCODONE-ACETAMINOPHEN 5-325 MG PO TABS: 1.0000 | ORAL_TABLET | ORAL | 0 refills | Status: DC | PRN

## 2017-09-11 MED ORDER — LACTATED RINGERS IV SOLN: INTRAVENOUS | Status: DC

## 2017-09-11 MED ORDER — OXYCODONE HCL 5 MG/5ML PO SOLN: 5.0000 mg | Freq: Once | ORAL | Status: DC | PRN

## 2017-09-11 MED ORDER — OXYCODONE HCL 5 MG PO TABS: 5.0000 mg | ORAL_TABLET | Freq: Once | ORAL | Status: DC | PRN

## 2017-09-11 MED ORDER — MEPERIDINE HCL 50 MG/ML IJ SOLN: 6.2500 mg | INTRAMUSCULAR | Status: DC | PRN

## 2017-09-11 MED ORDER — FENTANYL CITRATE (PF) 100 MCG/2ML IJ SOLN: INTRAMUSCULAR | Status: DC | PRN

## 2017-09-11 MED ORDER — LIDOCAINE HCL (CARDIAC) 20 MG/ML IV SOLN
INTRAVENOUS | Status: DC | PRN
Start: 2017-09-11 — End: 2017-09-11

## 2017-09-11 MED ORDER — CEFAZOLIN SODIUM-DEXTROSE 2-4 GM/100ML-% IV SOLN
INTRAVENOUS | Status: AC
  Filled 2017-09-11: qty 100

## 2017-09-11 MED ORDER — ONDANSETRON HCL 4 MG/2ML IJ SOLN: 4.0000 mg | Freq: Four times a day (QID) | INTRAMUSCULAR | Status: DC | PRN

## 2017-09-11 MED ORDER — OXYCODONE-ACETAMINOPHEN 5-325 MG PO TABS
1.0000 | ORAL_TABLET | ORAL | Status: DC | PRN
  Administered 2017-09-11: 1 via ORAL
  Filled 2017-09-11 (×2): qty 1

## 2017-09-11 MED ORDER — LACTATED RINGERS IV SOLN
INTRAVENOUS | Status: DC | PRN
  Administered 2017-09-11: 12:00:00 via INTRAVENOUS

## 2017-09-11 MED ORDER — ROCURONIUM BROMIDE 100 MG/10ML IV SOLN
INTRAVENOUS | Status: DC | PRN
  Administered 2017-09-11: 50 mg via INTRAVENOUS

## 2017-09-11 MED ORDER — DOCUSATE SODIUM 100 MG PO CAPS: 100.0000 mg | ORAL_CAPSULE | Freq: Every day | ORAL | 2 refills | Status: DC | PRN

## 2017-09-11 MED ORDER — KETOROLAC TROMETHAMINE 30 MG/ML IJ SOLN
INTRAMUSCULAR | Status: DC | PRN
  Administered 2017-09-11: 30 mg via INTRAVENOUS

## 2017-09-11 MED ORDER — OXYCODONE-ACETAMINOPHEN 5-325 MG PO TABS
1.0000 | ORAL_TABLET | ORAL | Status: DC | PRN
  Administered 2017-09-11: 1 via ORAL

## 2017-09-11 MED ORDER — IBUPROFEN 800 MG PO TABS: 800.0000 mg | ORAL_TABLET | Freq: Three times a day (TID) | ORAL | 0 refills | Status: DC | PRN

## 2017-09-11 MED ORDER — FLEET ENEMA 7-19 GM/118ML RE ENEM: 1.0000 | ENEMA | Freq: Once | RECTAL | Status: DC

## 2017-09-11 MED ORDER — FAMOTIDINE 20 MG PO TABS
ORAL_TABLET | ORAL | Status: AC
  Filled 2017-09-11: qty 1

## 2017-09-11 MED ORDER — BUPIVACAINE HCL 0.5 % IJ SOLN
INTRAMUSCULAR | Status: DC | PRN
  Administered 2017-09-11: 15 mL

## 2017-09-11 MED ORDER — FENTANYL CITRATE (PF) 100 MCG/2ML IJ SOLN
INTRAMUSCULAR | Status: DC | PRN
  Administered 2017-09-11: 50 ug via INTRAVENOUS

## 2017-09-11 MED ORDER — FENTANYL CITRATE (PF) 100 MCG/2ML IJ SOLN
25.0000 ug | INTRAMUSCULAR | Status: AC | PRN
  Administered 2017-09-11 (×6): 25 ug via INTRAVENOUS

## 2017-09-11 MED ORDER — EPHEDRINE SULFATE 50 MG/ML IJ SOLN
INTRAMUSCULAR | Status: AC
  Filled 2017-09-11: qty 1

## 2017-09-11 MED ORDER — PROPOFOL 10 MG/ML IV BOLUS
INTRAVENOUS | Status: DC | PRN
  Administered 2017-09-11: 200 mg via INTRAVENOUS

## 2017-09-11 MED ORDER — FENTANYL CITRATE (PF) 100 MCG/2ML IJ SOLN
INTRAMUSCULAR | Status: AC
  Administered 2017-09-11: 25 ug via INTRAVENOUS
  Filled 2017-09-11: qty 2

## 2017-09-11 MED ORDER — ONDANSETRON HCL 4 MG/2ML IJ SOLN
INTRAMUSCULAR | Status: DC | PRN
  Administered 2017-09-11: 4 mg via INTRAVENOUS

## 2017-09-11 MED ORDER — ACETAMINOPHEN NICU IV SYRINGE 10 MG/ML
INTRAVENOUS | Status: AC
  Filled 2017-09-11: qty 1

## 2017-09-11 MED ORDER — PROPOFOL 10 MG/ML IV BOLUS
INTRAVENOUS | Status: AC
  Filled 2017-09-11: qty 20

## 2017-09-11 MED ORDER — ACETAMINOPHEN 10 MG/ML IV SOLN
INTRAVENOUS | Status: DC | PRN
  Administered 2017-09-11: 1000 mg via INTRAVENOUS

## 2017-09-11 MED ORDER — ONDANSETRON HCL 4 MG PO TABS: 4.0000 mg | ORAL_TABLET | Freq: Four times a day (QID) | ORAL | Status: DC | PRN

## 2017-09-11 MED ORDER — CEFAZOLIN SODIUM-DEXTROSE 2-4 GM/100ML-% IV SOLN
2.0000 g | Freq: Once | INTRAVENOUS | Status: AC
  Administered 2017-09-11: 2 g via INTRAVENOUS

## 2017-09-11 MED ORDER — MIDAZOLAM HCL 2 MG/2ML IJ SOLN
INTRAMUSCULAR | Status: DC | PRN
  Administered 2017-09-11: 2 mg via INTRAVENOUS

## 2017-09-11 MED ORDER — LIDOCAINE HCL (PF) 2 % IJ SOLN
INTRAMUSCULAR | Status: AC
  Filled 2017-09-11: qty 10

## 2017-09-11 MED ORDER — SUGAMMADEX SODIUM 200 MG/2ML IV SOLN
INTRAVENOUS | Status: DC | PRN
  Administered 2017-09-11: 200 mg via INTRAVENOUS

## 2017-09-11 MED ORDER — BUPIVACAINE HCL (PF) 0.5 % IJ SOLN
INTRAMUSCULAR | Status: AC
Start: 2017-09-11 — End: ?
  Filled 2017-09-11: qty 30

## 2017-09-11 MED ORDER — PROMETHAZINE HCL 25 MG/ML IJ SOLN: 6.2500 mg | INTRAMUSCULAR | Status: DC | PRN

## 2017-09-11 MED ORDER — PHENYLEPHRINE HCL 10 MG/ML IJ SOLN
INTRAMUSCULAR | Status: DC | PRN
  Administered 2017-09-11: 100 ug via INTRAVENOUS
  Administered 2017-09-11: 50 ug via INTRAVENOUS

## 2017-09-11 MED ORDER — MIDAZOLAM HCL 2 MG/2ML IJ SOLN
INTRAMUSCULAR | Status: AC
  Filled 2017-09-11: qty 2

## 2017-09-11 MED ORDER — ONDANSETRON HCL 4 MG PO TABS: 4.0000 mg | ORAL_TABLET | Freq: Four times a day (QID) | ORAL | 0 refills | Status: DC | PRN

## 2017-09-11 MED ORDER — IBUPROFEN 800 MG PO TABS: 800.0000 mg | ORAL_TABLET | Freq: Three times a day (TID) | ORAL | Status: DC | PRN

## 2017-09-11 SURGICAL SUPPLY — 63 items
APPLICATOR COTTON TIP 6IN STRL (MISCELLANEOUS) ×4 IMPLANT
BAG URINE DRAINAGE (UROLOGICAL SUPPLIES) ×4 IMPLANT
BLADE SURG SZ11 CARB STEEL (BLADE) ×4 IMPLANT
CANISTER SUCT 1200ML W/VALVE (MISCELLANEOUS) ×4 IMPLANT
CATH FOLEY 2WAY  5CC 16FR (CATHETERS) ×2
CATH ROBINSON RED A/P 16FR (CATHETERS) ×4 IMPLANT
CATH URTH 16FR FL 2W BLN LF (CATHETERS) ×2 IMPLANT
CHLORAPREP W/TINT 26ML (MISCELLANEOUS) ×4 IMPLANT
CLOSURE WOUND 1/2 X4 (GAUZE/BANDAGES/DRESSINGS) ×1
CLOSURE WOUND 1/4X4 (GAUZE/BANDAGES/DRESSINGS) ×1
DERMABOND ADVANCED (GAUZE/BANDAGES/DRESSINGS) ×2
DERMABOND ADVANCED .7 DNX12 (GAUZE/BANDAGES/DRESSINGS) ×2 IMPLANT
DRAPE SURG 17X11 SM STRL (DRAPES) ×4 IMPLANT
DRSG TEGADERM 2-3/8X2-3/4 SM (GAUZE/BANDAGES/DRESSINGS) ×16 IMPLANT
ELECT REM PT RETURN 9FT ADLT (ELECTROSURGICAL) ×4
ELECTRODE REM PT RTRN 9FT ADLT (ELECTROSURGICAL) ×2 IMPLANT
FILTER LAP SMOKE EVAC STRL (MISCELLANEOUS) ×4 IMPLANT
GLOVE BIO SURGEON STRL SZ8 (GLOVE) ×16 IMPLANT
GOWN STRL REUS W/ TWL LRG LVL3 (GOWN DISPOSABLE) ×6 IMPLANT
GOWN STRL REUS W/ TWL XL LVL3 (GOWN DISPOSABLE) ×6 IMPLANT
GOWN STRL REUS W/TWL LRG LVL3 (GOWN DISPOSABLE) ×6
GOWN STRL REUS W/TWL XL LVL3 (GOWN DISPOSABLE) ×6
GRASPER SUT TROCAR 14GX15 (MISCELLANEOUS) ×4 IMPLANT
HANDLE YANKAUER SUCT BULB TIP (MISCELLANEOUS) ×4 IMPLANT
IRRIGATION STRYKERFLOW (MISCELLANEOUS) ×2 IMPLANT
IRRIGATOR STRYKERFLOW (MISCELLANEOUS) ×4
IV NS 1000ML (IV SOLUTION) ×2
IV NS 1000ML BAXH (IV SOLUTION) ×2 IMPLANT
KIT PINK PAD W/HEAD ARE REST (MISCELLANEOUS) ×4
KIT PINK PAD W/HEAD ARM REST (MISCELLANEOUS) ×2 IMPLANT
KIT TURNOVER CYSTO (KITS) ×4 IMPLANT
LABEL OR SOLS (LABEL) ×4 IMPLANT
NEEDLE HYPO 22GX1.5 SAFETY (NEEDLE) ×4 IMPLANT
NS IRRIG 500ML POUR BTL (IV SOLUTION) ×4 IMPLANT
PACK BASIN MINOR ARMC (MISCELLANEOUS) ×4 IMPLANT
PACK GYN LAPAROSCOPIC (MISCELLANEOUS) ×4 IMPLANT
PAD OB MATERNITY 4.3X12.25 (PERSONAL CARE ITEMS) ×4 IMPLANT
PAD PREP 24X41 OB/GYN DISP (PERSONAL CARE ITEMS) ×4 IMPLANT
POUCH SPECIMEN RETRIEVAL 10MM (ENDOMECHANICALS) IMPLANT
SCISSORS METZENBAUM CVD 33 (INSTRUMENTS) IMPLANT
SHEARS HARMONIC ACE PLUS 36CM (ENDOMECHANICALS) ×4 IMPLANT
SLEEVE ENDOPATH XCEL 5M (ENDOMECHANICALS) ×8 IMPLANT
SPONGE GAUZE 2X2 8PLY STER LF (GAUZE/BANDAGES/DRESSINGS) ×3
SPONGE GAUZE 2X2 8PLY STRL LF (GAUZE/BANDAGES/DRESSINGS) ×9 IMPLANT
SPONGE XRAY 4X4 16PLY STRL (MISCELLANEOUS) ×4 IMPLANT
STRIP CLOSURE SKIN 1/2X4 (GAUZE/BANDAGES/DRESSINGS) ×3 IMPLANT
STRIP CLOSURE SKIN 1/4X4 (GAUZE/BANDAGES/DRESSINGS) ×3 IMPLANT
SUT PDS 2-0 27IN (SUTURE) ×4 IMPLANT
SUT VIC AB 0 CT1 27 (SUTURE) ×4
SUT VIC AB 0 CT1 27XCR 8 STRN (SUTURE) ×4 IMPLANT
SUT VIC AB 0 CT1 36 (SUTURE) ×12 IMPLANT
SUT VIC AB 0 CT2 27 (SUTURE) ×4 IMPLANT
SUT VIC AB 2-0 UR6 27 (SUTURE) ×4 IMPLANT
SUT VIC AB 4-0 SH 27 (SUTURE) ×4
SUT VIC AB 4-0 SH 27XANBCTRL (SUTURE) ×4 IMPLANT
SWABSTK COMLB BENZOIN TINCTURE (MISCELLANEOUS) ×4 IMPLANT
SYR 10ML LL (SYRINGE) ×4 IMPLANT
SYR CONTROL 10ML (SYRINGE) ×4 IMPLANT
TROCAR ENDO BLADELESS 11MM (ENDOMECHANICALS) IMPLANT
TROCAR XCEL NON-BLD 5MMX100MML (ENDOMECHANICALS) ×4 IMPLANT
TROCAR XCEL UNIV SLVE 11M 100M (ENDOMECHANICALS) IMPLANT
TUBING INSUF HEATED (TUBING) ×4 IMPLANT
TUBING INSUFFLATION (TUBING) ×4 IMPLANT

## 2017-09-11 NOTE — Discharge Summary (Signed)
Pt dc reviewed and signed with LVernon, RN. Pt verbalizes understanding of all dc instruction. Prescriptions called into pharmacy. Pt dc'd to home. Escorted out via w/c by MLee, NT.

## 2017-09-11 NOTE — Brief Op Note (Signed)
09/11/2017  1:35 PM  PATIENT:  [name] FuchsMaleesa Paige [name]  32 y.o. female  PRE-OPERATIVE DIAGNOSIS:  Chronic Pelvic Pain Dyspareunia  POST-OPERATIVE DIAGNOSIS:  Same as above  PROCEDURE:  Procedure(s): LAPAROSCOPIC ASSISTED VAGINAL HYSTERECTOMY WITH SALPINGECTOMY (Bilateral)  Excisional biopsy of endometriosis left posterior cul de sac   SURGEON:  Surgeon(s) and Role:    * Oprah Camarena, Ihor Austinhomas J, MD - Primary  PHYSICIAN ASSISTANT: [name] , MD   ASSISTANTS:  Pa student Madison HickmanShea Whittaker   ANESTHESIA:   general  EBL:  50 cc , OU 200 cc IOF 800cc  BLOOD ADMINISTERED:none  DRAINS: Urinary Catheter (Foley)   LOCAL MEDICATIONS USED:  Amount: 10 ml  SPECIMEN:  Source of Specimen:  1. UTX, cx , bilateral tubes . 2. peritoneal biopsy cul de sac posterior   DISPOSITION OF SPECIMEN:  PATHOLOGY  COUNTS:  YES  TOURNIQUET:  * No tourniquets in log *  DICTATION: .Other Dictation: Dictation Number verbal  PLAN OF CARE: Admit for overnight observation  PATIENT DISPOSITION:  PACU - hemodynamically stable.   Delay start of Pharmacological VTE agent (>24hrs) due to surgical blood loss or risk of bleeding: not applicable

## 2017-09-11 NOTE — Anesthesia Preprocedure Evaluation (Signed)
Anesthesia Evaluation  Patient identified by MRN, date of birth, ID band Patient awake    Reviewed: Allergy & Precautions, NPO status , Patient's Chart, lab work & pertinent test results  History of Anesthesia Complications Negative for: history of anesthetic complications  Airway Mallampati: II  TM Distance: >3 FB Neck ROM: Full    Dental no notable dental hx.    Pulmonary neg sleep apnea, neg PE   breath sounds clear to auscultation- rhonchi (-) wheezing      Cardiovascular Exercise Tolerance: Good (-) hypertension(-) CAD, (-) Past MI, (-) Cardiac Stents and (-) CABG  Rhythm:Regular Rate:Normal - Systolic murmurs and - Diastolic murmurs    Neuro/Psych  Headaches, negative psych ROS   GI/Hepatic negative GI ROS, Neg liver ROS,   Endo/Other  negative endocrine ROSneg diabetes  Renal/GU negative Renal ROS     Musculoskeletal negative musculoskeletal ROS (+)   Abdominal (+) + obese,   Peds  Hematology negative hematology ROS (+)   Anesthesia Other Findings Past Medical History: No date: Asthma     Comment:  as a child No date: Headache     Comment:  migraines No date: Heart murmur     Comment:  asd murmur. saw cardiologist until age 32 and it was so               small. rechecked 6 years ago and all was fine   Reproductive/Obstetrics                             Anesthesia Physical Anesthesia Plan  ASA: II  Anesthesia Plan: General   Post-op Pain Management:    Induction: Intravenous  PONV Risk Score and Plan: 2 and Ondansetron, Dexamethasone and Midazolam  Airway Management Planned: Oral ETT  Additional Equipment:   Intra-op Plan:   Post-operative Plan: Extubation in OR  Informed Consent: I have reviewed the patients History and Physical, chart, labs and discussed the procedure including the risks, benefits and alternatives for the proposed anesthesia with the patient or  authorized representative who has indicated his/her understanding and acceptance.   Dental advisory given  Plan Discussed with: CRNA and Anesthesiologist  Anesthesia Plan Comments:         Anesthesia Quick Evaluation

## 2017-09-11 NOTE — Discharge Summary (Signed)
Physician Discharge Summary  Patient ID: [name] FuchsMaleesa Paige [name] MRN: [id number] DOB/AGE: 32/08/1985 31 y.o.  Admit date: 09/11/2017 Discharge date: 09/11/2017  Admission Diagnoses:chronic pelvic pain   Discharge Diagnoses: same  Active Problems:   Postoperative state   Discharged Condition: good  Hospital Course: she underwent an uncomplicated LAVH and bilateral salpingectomy  And peritoneal biopsy . Post op did well   Consults: None  Significant Diagnostic Studies: labs:  Results for orders placed or performed during the hospital encounter of 09/11/17 (from the past 24 hour(s))  Pregnancy, urine POC     Status: None   Collection Time: 09/11/17  9:25 AM  Result Value Ref Range   Preg Test, Ur NEGATIVE NEGATIVE  ABO/Rh     Status: None   Collection Time: 09/11/17  9:42 AM  Result Value Ref Range   ABO/RH(D)      O POS Performed at [facility], [address] [place], [place] [address]   CBC     Status: Abnormal   Collection Time: 09/11/17  6:53 PM  Result Value Ref Range   WBC 11.3 (H) 3.6 - 11.0 K/uL   RBC 4.90 3.80 - 5.20 MIL/uL   Hemoglobin 13.9 12.0 - 16.0 g/dL   HCT 19.142.3 47.835.0 - 29.547.0 %   MCV 86.3 80.0 - 100.0 fL   MCH 28.4 26.0 - 34.0 pg   MCHC 32.9 32.0 - 36.0 g/dL   RDW 62.113.1 30.811.5 - 65.714.5 %   Platelets 281 150 - 440 K/uL  Basic metabolic panel     Status: Abnormal   Collection Time: 09/11/17  6:53 PM  Result Value Ref Range   Sodium 136 135 - 145 mmol/L   Potassium 3.8 3.5 - 5.1 mmol/L   Chloride 108 101 - 111 mmol/L   CO2 15 (L) 22 - 32 mmol/L   Glucose, Bld 206 (H) 65 - 99 mg/dL   BUN 10 6 - 20 mg/dL   Creatinine, Ser 8.460.91 0.44 - 1.00 mg/dL   Calcium 8.9 8.9 - 96.210.3 mg/dL   GFR calc non Af Amer >60 >60 mL/min   GFR calc Af Amer >60 >60 mL/min   Anion gap 13 5 - 15    Treatments: surgery: as above  Discharge Exam: Blood pressure 115/72, pulse 75, temperature 98 F (36.7 C), temperature source Oral, resp. rate 16, height 5\' 2"  (1.575 m),  weight 106.6 kg (235 lb), SpO2 98 %. General appearance: alert and cooperative  abd soft NT , incision cdi   Disposition: 01-Home or Self Care  Discharge Instructions    Call MD for:  difficulty breathing, headache or visual disturbances   Complete by:  As directed    Call MD for:  extreme fatigue   Complete by:  As directed    Call MD for:  hives   Complete by:  As directed    Call MD for:  persistant dizziness or light-headedness   Complete by:  As directed    Call MD for:  persistant nausea and vomiting   Complete by:  As directed    Call MD for:  redness, tenderness, or signs of infection (pain, swelling, redness, odor or green/yellow discharge around incision site)   Complete by:  As directed    Call MD for:  severe uncontrolled pain   Complete by:  As directed    Call MD for:  temperature >100.4   Complete by:  As directed    Diet - low sodium heart healthy  Complete by:  As directed    Diet general   Complete by:  As directed    Increase activity slowly   Complete by:  As directed    Sexual Activity Restrictions   Complete by:  As directed    Pelvic rest 4 weeks     Allergies as of 09/11/2017      Reactions   Azithromycin Hives, Shortness Of Breath, Rash   Topamax [topiramate] Shortness Of Breath   Dizziness       Medication List    STOP taking these medications   amoxicillin 500 MG capsule Commonly known as:  AMOXIL   loperamide 2 MG capsule Commonly known as:  IMODIUM   medroxyPROGESTERone 150 MG/ML injection Commonly known as:  DEPO-PROVERA     TAKE these medications   aspirin-acetaminophen-caffeine 250-250-65 MG tablet Commonly known as:  EXCEDRIN MIGRAINE Take 2 tablets by mouth daily as needed for headache.   cetirizine 10 MG tablet Commonly known as:  ZYRTEC Take 1 tablet (10 mg total) by mouth daily.   docusate sodium 100 MG capsule Commonly known as:  COLACE Take 1 capsule (100 mg total) by mouth daily as needed.   ibuprofen 800 MG  tablet Commonly known as:  ADVIL,MOTRIN Take 1 tablet (800 mg total) by mouth every 8 (eight) hours as needed (mild pain).   LUBRICATING EYE DROPS OP Apply 1 drop to eye daily as needed (dry eyes).   ondansetron 4 MG tablet Commonly known as:  ZOFRAN Take 1 tablet (4 mg total) by mouth every 6 (six) hours as needed for nausea.   oxyCODONE-acetaminophen 5-325 MG tablet Commonly known as:  PERCOCET/ROXICET Take 1 tablet by mouth every 4 (four) hours as needed for moderate pain ((when tolerating fluids)).      Follow-up Information    Schermerhorn, Ihor Austin, MD Follow up in 2 week(s).   Specialty:  Obstetrics and Gynecology Why:  post op  Contact information: [address] [place] [address] [phone]           Signed: Ihor Austin Schermerhorn 09/11/2017, 8:10 PM

## 2017-09-11 NOTE — Anesthesia Procedure Notes (Signed)
Procedure Name: Intubation Date/Time: 09/11/2017 12:00 PM Performed by: Allean Found, CRNA Pre-anesthesia Checklist: Patient identified, Emergency Drugs available, Suction available, Patient being monitored and Timeout performed Patient Re-evaluated:Patient Re-evaluated prior to induction Oxygen Delivery Method: Circle system utilized Preoxygenation: Pre-oxygenation with 100% oxygen Induction Type: IV induction Ventilation: Mask ventilation without difficulty Laryngoscope Size: Mac and 3 Grade View: Grade II Tube type: Oral Tube size: 7.0 mm Number of attempts: 1 Airway Equipment and Method: Stylet Dental Injury: Teeth and Oropharynx as per pre-operative assessment

## 2017-09-11 NOTE — Progress Notes (Signed)
Pt ready for surgery  All questions answered   labs reviewed  Proceed

## 2017-09-11 NOTE — Transfer of Care (Signed)
Immediate Anesthesia Transfer of Care Note  Patient: [name] FuchsMaleesa Paige [name]  Procedure(s) Performed: LAPAROSCOPIC ASSISTED VAGINAL HYSTERECTOMY WITH SALPINGECTOMY (Bilateral )  Patient Location: PACU  Anesthesia Type:General  Level of Consciousness: awake  Airway & Oxygen Therapy: Patient Spontanous Breathing and Patient connected to face mask oxygen  Post-op Assessment: Report given to RN and Post -op Vital signs reviewed and stable  Post vital signs: Reviewed and stable  Last Vitals:  Vitals:   09/11/17 0924 09/11/17 1351  BP: 126/78 117/62  Pulse: 89 89  Resp: 18 20  Temp: 36.8 C 37.2 C  SpO2: 100% 97%    Last Pain:  Vitals:   09/11/17 0924  TempSrc: Oral  PainSc: 0-No pain         Complications: No apparent anesthesia complications

## 2017-09-11 NOTE — Anesthesia Postprocedure Evaluation (Signed)
Anesthesia Post Note  Patient: Christiana FuchsMaleesa Paige Whitmore  Procedure(s) Performed: LAPAROSCOPIC ASSISTED VAGINAL HYSTERECTOMY WITH SALPINGECTOMY (Bilateral )  Patient location during evaluation: PACU Anesthesia Type: General Level of consciousness: awake and alert Pain management: pain level controlled Vital Signs Assessment: post-procedure vital signs reviewed and stable Respiratory status: spontaneous breathing, nonlabored ventilation, respiratory function stable and patient connected to nasal cannula oxygen Cardiovascular status: blood pressure returned to baseline and stable Postop Assessment: no apparent nausea or vomiting Anesthetic complications: no     Last Vitals:  Vitals:   09/11/17 1436 09/11/17 1449  BP: 116/63 108/60  Pulse: 80 77  Resp: 10 14  Temp:  36.8 C  SpO2: 97% 93%    Last Pain:  Vitals:   09/11/17 1449  TempSrc:   PainSc: 3                  Cleda MccreedyJoseph K Cyrilla Durkin

## 2017-09-11 NOTE — Anesthesia Post-op Follow-up Note (Signed)
Anesthesia QCDR form completed.        

## 2017-09-12 NOTE — Op Note (Signed)
NAMThomos [name]:  [name], [name]            ACCOUNT NO.:  [id number]  MEDICAL RECORD NO.:  [id number]  LOCATION:                                 FACILITY:  PHYSICIAN:  [name], MDDATE OF BIRTH:  [birth year]  DATE OF PROCEDURE:  09/11/2017 DATE OF DISCHARGE:                              OPERATIVE REPORT   PREOPERATIVE DIAGNOSIS: 1. Chronic pelvic pain. 2. Chronic dyspareunia.  POSTOPERATIVE DIAGNOSIS: 1. Chronic pelvic pain. 2. Chronic dyspareunia. 3. Possible endometriosis.  PROCEDURE PERFORMED: 1. Laparoscopic-assisted vaginal hysterectomy. 2. Bilateral salpingectomy. 3. Excisional biopsy of endometriosis in the left posterior cul-de-     sac.    SURGEON:  [name], MD.  FIRST ASSISTANT:  [name], MD.  SECOND ASSISTANT:  PA student, [name].  ANESTHESIA:  General endotracheal anesthesia.  INDICATION:  A 32 year old gravida 2, para 2 patient with a long history of chronic pelvic pain which was cyclic, 2 weeks prior to commencement of the cycle.  The patient had cyclic dyspareunia as well.  The patient's mother and grandmother both have endometriosis.  FINDING:  Scarring in the posterior cul-de-sac on the left, consistent with possible endometriosis.  DESCRIPTION OF PROCEDURE:  After adequate general endotracheal anesthesia, the patient was placed in dorsal supine position, legs in the [place] stirrups.  The patient's abdomen, perineum, and vagina were prepped and draped in normal sterile fashion.  A time-out was performed. The patient did receive 2 g IV Ancef prior to commencement of the case. A speculum was placed in the vagina and the anterior cervix was grasped with a single-tooth tenaculum.  A red Robinson catheter used to drain the bladder, yielded 150 mL clear urine.  Gloves were changed. Attention was directed to the patient's abdomen.  A 5 mm infraumbilical incision was made after injected with 0.5% Marcaine.  The  laparoscope was then advanced into the abdominal cavity under direct visualization with the Optiview cannula.  The patient's abdomen was insufflated with carbon dioxide.  Second port site was placed in left lower quadrant 3 cm medial to the left anterior iliac spine and a 5 mm trocar was advanced under direct visualization.  Third port site was placed in the right lower quadrant, again 3 cm medial to the right anterior iliac spine. Under direct visualization, trocar was advanced.  The patient was placed in Trendelenburg.  Each fallopian tube was grasped with the fimbriated end and with the Harmonic scalpel, bilateral salpingectomies were performed.  Round ligaments were opened bilaterally and broad ligaments were opened bilaterally and the uterine artery was skeletonized bilaterally.  Kleppinger cautery was used to cauterize the uterine artery bilaterally and then transected with Harmonic scalpel.  Anterior bladder flap was opened and dissected off the lower uterine segment. Attention was then directed vaginally.  Legs were placed high.  A weighted speculum was placed in the posterior vaginal vault and the cervix was grasped with 2 thyroid tenacula.  The cervix was circumferentially injected with 1% lidocaine with epinephrine and a direct posterior colpotomy incision was made.  Upon entry into the posterior cul-de-sac, uterosacral ligaments were bilaterally clamped, transected, and tagged for later identification.  The anterior cervix was then incised with the Bovie.  The anterior  cul-de-sac was entered and a Occupational hygienist was placed within to elevate the bladder anteriorly.  The cardinal ligaments were bilaterally clamped, transected, suture ligated, and the uterus and attached fallopian tubes were then delivered through the incision.  Good hemostasis was noted. The peritoneum was closed with a pursestring 2-0 PDS suture and the vaginal cuff was then closed with a running 0 Vicryl  suture.  Good approximation of edges with good hemostasis noted.  A Foley catheter was placed at the end of the case yielding additional 50 mL clear urine. Attention then was directed to the patient's abdomen.  The laparoscope was readvanced and left posterior cul-de-sac with white scarring was grasped with a grasper and Harmonic scalp was used to dissect this presumed endometriotic implant free.  This will be sent to pathology for separate identification.  Good hemostasis was noted as well as all the other pedicles.  The patient's abdomen was deflated to 7 mmHg with good hemostasis noted.  All ports were removed and the ports were closed with interrupted 4-0 Vicryl suture.  Dermabond was placed over each incisional site.  There were no complications.  ESTIMATED BLOOD LOSS:  50 mL.  INTRAOPERATIVE FLUIDS:  800 mL.  URINE OUTPUT:  200 mL.  The patient tolerated the procedure well and was taken to recovery room in good condition.          ______________________________ Jennell Corner, MD     TS/MEDQ  D:  09/11/2017  T:  09/12/2017  Job:  [id number]

## 2017-09-13 ENCOUNTER — Encounter: Payer: Self-pay | Admitting: Obstetrics and Gynecology

## 2017-09-13 LAB — SURGICAL PATHOLOGY

## 2017-09-27 DIAGNOSIS — N803 Endometriosis of pelvic peritoneum: Secondary | ICD-10-CM | POA: Insufficient documentation

## 2017-09-27 DIAGNOSIS — IMO0002 Reserved for concepts with insufficient information to code with codable children: Secondary | ICD-10-CM | POA: Insufficient documentation

## 2017-12-06 ENCOUNTER — Ambulatory Visit
Admission: EM | Admit: 2017-12-06 | Discharge: 2017-12-06 | Disposition: A | Discharge status: Home / Self Care | Payer: Medicaid Other | Attending: Family Medicine | Admitting: Family Medicine

## 2017-12-06 ENCOUNTER — Other Ambulatory Visit: Payer: Self-pay

## 2017-12-06 DIAGNOSIS — B9789 Other viral agents as the cause of diseases classified elsewhere: Secondary | ICD-10-CM

## 2017-12-06 DIAGNOSIS — J069 Acute upper respiratory infection, unspecified: Secondary | ICD-10-CM

## 2017-12-06 DIAGNOSIS — R05 Cough: Secondary | ICD-10-CM

## 2017-12-06 NOTE — ED Triage Notes (Signed)
Patient complains of sinus pain and pressure, body aches, facial pain, cough since yesterday. Patient states that she has been trying claritin with sudafed without relief.

## 2017-12-06 NOTE — ED Provider Notes (Signed)
[facility]    CSN: [id number] Arrival date & time: 12/06/17  1329     History   Chief Complaint Chief Complaint  Patient presents with  . Sinusitis    HPI [name] [name] is a 32 y.o. female.   The history is provided by the patient.  Sinusitis  Associated symptoms: congestion, cough and rhinorrhea   Associated symptoms: no wheezing   URI  Presenting symptoms: congestion, cough and rhinorrhea   Severity:  Moderate Onset quality:  Sudden Duration:  1 day Timing:  Constant Progression:  Unchanged Chronicity:  New Relieved by:  Nothing Ineffective treatments:  OTC medications (tried claritin with sudafed since yesterday when symptoms started) Associated symptoms: no wheezing   Risk factors: sick contacts   Risk factors: not elderly, no chronic cardiac disease, no chronic kidney disease, no chronic respiratory disease, no diabetes mellitus, no immunosuppression, no recent illness and no recent travel     Past Medical History:  Diagnosis Date  . Asthma    as a child  . Headache    migraines  . Heart murmur    asd murmur. saw cardiologist until age 40 and it was so small. rechecked 6 years ago and all was fine    Patient Active Problem List   Diagnosis Date Noted  . Postoperative state 09/11/2017    Past Surgical History:  Procedure Laterality Date  . KNEE ARTHROSCOPY Right 2004  . LAPAROSCOPIC VAGINAL HYSTERECTOMY WITH SALPINGECTOMY Bilateral 09/11/2017   Procedure: LAPAROSCOPIC ASSISTED VAGINAL HYSTERECTOMY WITH SALPINGECTOMY;  Surgeon: Schermerhorn, Ihor Austin, MD;  Location: [facility] ORS;  Service: Gynecology;  Laterality: Bilateral;  . TONSILLECTOMY  age 34    OB History   None      Home Medications    Prior to Admission medications   Medication Sig Start Date End Date Taking? Authorizing Provider  aspirin-acetaminophen-caffeine (EXCEDRIN MIGRAINE) [phone] MG tablet Take 2 tablets by mouth daily as needed for headache.   Yes  [provider]  Carboxymethylcellul-Glycerin (LUBRICATING EYE DROPS OP) Apply 1 drop to eye daily as needed (dry eyes).   Yes [provider]  ibuprofen (ADVIL,MOTRIN) 800 MG tablet Take 1 tablet (800 mg total) by mouth every 8 (eight) hours as needed (mild pain). 09/11/17  Yes Schermerhorn, Ihor Austin, MD  loratadine (CLARITIN) 10 MG tablet Take 10 mg by mouth daily.   Yes [provider]  cetirizine (ZYRTEC) 10 MG tablet Take 1 tablet (10 mg total) by mouth daily. Patient not taking: Reported on 09/11/2017 08/31/17   Candis Schatz, PA-C  docusate sodium (COLACE) 100 MG capsule Take 1 capsule (100 mg total) by mouth daily as needed. 09/11/17 09/11/18  Schermerhorn, Ihor Austin, MD  ondansetron (ZOFRAN) 4 MG tablet Take 1 tablet (4 mg total) by mouth every 6 (six) hours as needed for nausea. 09/11/17   Schermerhorn, Ihor Austin, MD  oxyCODONE-acetaminophen (PERCOCET/ROXICET) 5-325 MG tablet Take 1 tablet by mouth every 4 (four) hours as needed for moderate pain ((when tolerating fluids)). 09/11/17   Schermerhorn, Ihor Austin, MD    Family History Family History  Problem Relation Age of Onset  . Cardiomyopathy Father   . Migraines Mother     Social History Social History   Tobacco Use  . Smoking status: Never Smoker  . Smokeless tobacco: Never Used  Substance Use Topics  . Alcohol use: Yes    Alcohol/week: 0.0 oz    Comment: rarely  . Drug use: No     Allergies  Azithromycin and Topamax [topiramate]   Review of Systems Review of Systems  HENT: Positive for congestion and rhinorrhea.   Respiratory: Positive for cough. Negative for wheezing.      Physical Exam Triage Vital Signs ED Triage Vitals  Enc Vitals Group     BP 12/06/17 1338 125/88     Pulse Rate 12/06/17 1338 84     Resp 12/06/17 1338 18     Temp 12/06/17 1338 98.8 F (37.1 C)     Temp Source 12/06/17 1338 Oral     SpO2 12/06/17 1338 98 %     Weight 12/06/17 1335 210 lb (95.3 kg)     Height  12/06/17 1335  (1.575 m)     Head Circumference --      Peak Flow --      Pain Score 12/06/17 1335 7     Pain Loc --      Pain Edu? --      Excl. in GC? --    No data found.  Updated Vital Signs BP 125/88 (BP Location: Right Arm)   Pulse 84   Temp 98.8 F (37.1 C) (Oral)   Resp 18   Ht  (1.575 m)   Wt 210 lb (95.3 kg)   LMP 01/16/2017   SpO2 98%   BMI 38.41 kg/m   Visual Acuity Right Eye Distance:   Left Eye Distance:   Bilateral Distance:    Right Eye Near:   Left Eye Near:    Bilateral Near:     Physical Exam  Constitutional: She appears well-developed and well-nourished. No distress.  HENT:  Head: Normocephalic and atraumatic.  Right Ear: Tympanic membrane, external ear and ear canal normal.  Left Ear: Tympanic membrane, external ear and ear canal normal.  Nose: Mucosal edema and rhinorrhea present. No nose lacerations, sinus tenderness, nasal deformity, septal deviation or nasal septal hematoma. No epistaxis.  No foreign bodies. Right sinus exhibits no maxillary sinus tenderness and no frontal sinus tenderness. Left sinus exhibits no maxillary sinus tenderness and no frontal sinus tenderness.  Mouth/Throat: Uvula is midline and mucous membranes are normal. Posterior oropharyngeal erythema present. No oropharyngeal exudate, posterior oropharyngeal edema or tonsillar abscesses. No tonsillar exudate.  Eyes: Conjunctivae are normal. Right eye exhibits no discharge. Left eye exhibits no discharge. No scleral icterus.  Neck: Normal range of motion. Neck supple. No thyromegaly present.  Cardiovascular: Normal rate, regular rhythm and normal heart sounds.  Pulmonary/Chest: Effort normal and breath sounds normal. No stridor. No respiratory distress. She has no wheezes. She has no rales.  Lymphadenopathy:    She has no cervical adenopathy.  Skin: She is not diaphoretic.  Nursing note and vitals reviewed.    UC Treatments / Results  Labs (all labs ordered are  listed, but only abnormal results are displayed) Labs Reviewed - No data to display  EKG None  Radiology No results found.  Procedures Procedures (including critical care time)  Medications Ordered in UC Medications - No data to display  Initial Impression / Assessment and Plan / UC Course  I have reviewed the triage vital signs and the nursing notes.  Pertinent labs & imaging results that were available during my care of the patient were reviewed by me and considered in my medical decision making (see chart for details).      Final Clinical Impressions(s) / UC Diagnoses   Final diagnoses:  Viral URI with cough    ED Prescriptions    None  1. diagnosis reviewed with patient 2. rx as per orders above; reviewed possible side effects, interactions, risks and benefits  3. Recommend supportive treatment with fluids,rest, otc meds  4. Follow-up prn if symptoms worsen or don't improve Controlled Substance Prescriptions [place] Controlled Substance Registry consulted? Not Applicable   Payton Mccallum, MD 12/06/17 [phone]

## 2017-12-06 NOTE — Discharge Instructions (Signed)
Flonase or Nasacort.

## 2017-12-25 ENCOUNTER — Ambulatory Visit: Payer: Self-pay | Admitting: Family Medicine

## 2017-12-25 VITALS — BP 135/88 | HR 68 | Temp 98.6°F | Resp 16

## 2017-12-25 DIAGNOSIS — J029 Acute pharyngitis, unspecified: Secondary | ICD-10-CM

## 2017-12-25 DIAGNOSIS — H6983 Other specified disorders of Eustachian tube, bilateral: Secondary | ICD-10-CM

## 2017-12-25 DIAGNOSIS — J309 Allergic rhinitis, unspecified: Secondary | ICD-10-CM

## 2017-12-25 LAB — POCT RAPID STREP A (OFFICE): RAPID STREP A SCREEN: NEGATIVE

## 2017-12-25 MED ORDER — FLUTICASONE PROPIONATE 50 MCG/ACT NA SUSP: 2.0000 | Freq: Every day | NASAL | 1 refills | Status: DC

## 2017-12-25 NOTE — Progress Notes (Signed)
Subjective: sore throat HPI:  [name] [name] is a 32 y.o. female who presents for evaluation of sore throat, nonproductive cough, and "ear popping" for 1 day. Denies fevers, malaise, fatigue, any other systemic symptoms.  Patient has a history of seasonal allergic rhinitis.  Patient takes Claritin as needed for this.  Patient endorses bilateral muffled hearing and pain just inferior to the mandibular angle.  Denies any significant history of ENT problems or surgeries in the past, aside from a tonsillectomy as a child. Treatment to date: None.  Denies any other symptoms or complaints.  Patient denies tobacco use.  Patient reports a history of asthma as a child but no issues as an adult.  Denies shortness of breath, wheezing, or chest or back pain.  Review of Systems Pertinent items noted in HPI and remainder of comprehensive ROS otherwise negative.     Objective:   Physical Exam General: Awake, alert, and oriented. No acute distress. Well developed, hydrated and nourished. Appears stated age. Nontoxic appearance.  HEENT: No PND noted.  Mild erythema to posterior oropharynx.  No edema or exudates of pharynx.  Patient is status post tonsillectomy.  No erythema, opaque fluid, or bulging of TM. Fullness noted to bilateral TM with clear fluid present. Pale/boggy nasal mucosa. Sinuses nontender. Supple neck without adenopathy.  Cardiac: Heart rate and rhythm are normal. No murmurs, gallops, or rubs are auscultated. S1 and S2 are heard and are of normal intensity.  Respiratory: No signs of respiratory distress. Lungs clear. No tachypnea. Able to speak in full sentences without dyspnea. Nonlabored respirations.   Skin: Skin is warm, dry and intact. Appropriate color for ethnicity. No cyanosis noted.   Diagnostics: Strep test negative.  Assessment:   Eustachian Tube Dysfunction Allergic Rhinitis  Viral pharyngitis   Plan:   Discussed expected course and treatment. Prescribed Flonase and  educated patient regarding dosage and instructions for use.  Discussed side/adverse effects. Discussed OTC treatments.  Discussed red flag symptoms and circumstances with which to return to care.   New Prescriptions   FLUTICASONE (FLONASE) 50 MCG/ACT NASAL SPRAY    Place 2 sprays into both nostrils daily.

## 2018-02-01 ENCOUNTER — Other Ambulatory Visit: Payer: Self-pay | Admitting: Obstetrics and Gynecology

## 2018-02-01 DIAGNOSIS — N644 Mastodynia: Secondary | ICD-10-CM

## 2018-02-07 ENCOUNTER — Ambulatory Visit
Admission: RE | Admit: 2018-02-07 | Discharge: 2018-02-07 | Disposition: A | Discharge status: Home / Self Care | Payer: Managed Care, Other (non HMO) | Source: Ambulatory Visit | Attending: Obstetrics and Gynecology | Admitting: Obstetrics and Gynecology

## 2018-02-07 DIAGNOSIS — N644 Mastodynia: Secondary | ICD-10-CM

## 2018-04-08 ENCOUNTER — Ambulatory Visit
Admission: EM | Admit: 2018-04-08 | Discharge: 2018-04-08 | Disposition: A | Discharge status: Home / Self Care | Payer: Managed Care, Other (non HMO) | Attending: Emergency Medicine | Admitting: Emergency Medicine

## 2018-04-08 ENCOUNTER — Encounter: Payer: Self-pay | Admitting: Emergency Medicine

## 2018-04-08 ENCOUNTER — Other Ambulatory Visit: Payer: Self-pay

## 2018-04-08 DIAGNOSIS — J302 Other seasonal allergic rhinitis: Secondary | ICD-10-CM

## 2018-04-08 DIAGNOSIS — J0100 Acute maxillary sinusitis, unspecified: Secondary | ICD-10-CM

## 2018-04-08 DIAGNOSIS — M545 Low back pain, unspecified: Secondary | ICD-10-CM

## 2018-04-08 LAB — URINALYSIS, COMPLETE (UACMP) WITH MICROSCOPIC
BILIRUBIN URINE: NEGATIVE
GLUCOSE, UA: NEGATIVE mg/dL
HGB URINE DIPSTICK: NEGATIVE
KETONES UR: NEGATIVE mg/dL
LEUKOCYTES UA: NEGATIVE
NITRITE: NEGATIVE
Protein, ur: NEGATIVE mg/dL
RBC / HPF: NONE SEEN RBC/hpf (ref 0–5)
Specific Gravity, Urine: 1.025 (ref 1.005–1.030)
pH: 5 (ref 5.0–8.0)

## 2018-04-08 MED ORDER — METHOCARBAMOL 750 MG PO TABS: 1500.0000 mg | ORAL_TABLET | Freq: Three times a day (TID) | ORAL | 0 refills | Status: AC | PRN

## 2018-04-08 MED ORDER — MOMETASONE FUROATE 50 MCG/ACT NA SUSP: 2.0000 | Freq: Every day | NASAL | 0 refills | Status: DC

## 2018-04-08 MED ORDER — OLOPATADINE HCL 0.1 % OP SOLN: 1.0000 [drp] | Freq: Two times a day (BID) | OPHTHALMIC | 0 refills | Status: DC

## 2018-04-08 MED ORDER — IBUPROFEN 600 MG PO TABS: 600.0000 mg | ORAL_TABLET | Freq: Four times a day (QID) | ORAL | 0 refills | Status: DC | PRN

## 2018-04-08 NOTE — Discharge Instructions (Addendum)
For your back: Robaxin, ibuprofen 600 mg take with 1 g of Tylenol take together 3 or 4 times a day as needed for pain.  For your allergies/sinusitis: start Claritin-D, Allegra-D or Zyrtec-D.  Also Patanol for your eyes. try Nasonex.  Advised saline nasal irrigation with Lloyd Huger med rinse and distilled water.  You may do this as often as you want.  Follow the directions on the box..  Return to this clinic or follow-up with your PMD if not better in 1 week and we can consider antibiotics at that time.  Go to www.goodrx.com to look up your medications. This will give you a list of where you can find your prescriptions at the most affordable prices. Or you can ask the pharmacist what the cash price is. This is frequently cheaper than going through insurance.
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
## 2018-04-08 NOTE — ED Triage Notes (Signed)
Patient c/o low back pain that started 5 days ago. Patient stated she has a history of bladder infections and this pain feels the same. Patient also c/o sinus pain, pressure and drainage x 5 days.

## 2018-04-10 LAB — URINE CULTURE: Special Requests: NORMAL

## 2018-05-29 ENCOUNTER — Other Ambulatory Visit: Payer: Self-pay

## 2018-05-29 ENCOUNTER — Emergency Department: Payer: Managed Care, Other (non HMO)

## 2018-05-29 ENCOUNTER — Emergency Department
Admission: EM | Admit: 2018-05-29 | Discharge: 2018-05-29 | Disposition: A | Discharge status: Home / Self Care | Payer: Managed Care, Other (non HMO) | Attending: Emergency Medicine | Admitting: Emergency Medicine

## 2018-05-29 ENCOUNTER — Encounter: Payer: Self-pay | Admitting: Emergency Medicine

## 2018-05-29 DIAGNOSIS — M25532 Pain in left wrist: Secondary | ICD-10-CM | POA: Diagnosis not present

## 2018-05-29 DIAGNOSIS — J45909 Unspecified asthma, uncomplicated: Secondary | ICD-10-CM | POA: Insufficient documentation

## 2018-05-29 DIAGNOSIS — Z79899 Other long term (current) drug therapy: Secondary | ICD-10-CM | POA: Insufficient documentation

## 2018-05-29 MED ORDER — OXYCODONE-ACETAMINOPHEN 7.5-325 MG PO TABS: 1.0000 | ORAL_TABLET | Freq: Four times a day (QID) | ORAL | 0 refills | Status: DC | PRN

## 2018-05-29 NOTE — ED Provider Notes (Signed)
[facility] Emergency Department Provider Note   ____________________________________________   First MD Initiated Contact with Patient 05/29/18 1509     (approximate)  I have reviewed the triage vital signs and the nursing notes.   HISTORY  Chief Complaint Wrist Pain    HPI Io [name] [name] is a 32 y.o. female patient complain of 1 month of increasing left wrist pain.  Patient was seen by [facility] and diagnosed with tendinitis.  Patient states she received steroidal injection and a prescription for prednisone which she has completed.  Patient states no change in her pain complaint.  Patient states she was seen by urgent care clinic 2 days ago placed in a wrist splint.  Patient has rescheduled to see [facility] in 2 days.  Patient requesting pain relief until she can see her orthopedic doctor.  Patient is right-hand dominant.  Patient rates the pain as 8/10.  Patient state pain increases with flexion extension of the wrist.  Patient described the pain as "achy".  Patient denies loss of sensation.  Past Medical History:  Diagnosis Date  . Asthma    as a child  . Headache    migraines  . Heart murmur    asd murmur. saw cardiologist until age 6 and it was so small. rechecked 6 years ago and all was fine    Patient Active Problem List   Diagnosis Date Noted  . Postoperative state 09/11/2017    Past Surgical History:  Procedure Laterality Date  . KNEE ARTHROSCOPY Right 2004  . LAPAROSCOPIC VAGINAL HYSTERECTOMY WITH SALPINGECTOMY Bilateral 09/11/2017   Procedure: LAPAROSCOPIC ASSISTED VAGINAL HYSTERECTOMY WITH SALPINGECTOMY;  Surgeon: Schermerhorn, Ihor Austin, MD;  Location: [facility] ORS;  Service: Gynecology;  Laterality: Bilateral;  . TONSILLECTOMY  age 61    Prior to Admission medications   Medication Sig Start Date End Date Taking? Authorizing Provider  ibuprofen (ADVIL,MOTRIN) 600 MG tablet Take 1 tablet (600 mg total) by mouth every 6 (six)  hours as needed. 04/08/18   Domenick Gong, MD  mometasone (NASONEX) 50 MCG/ACT nasal spray Place 2 sprays into the nose daily. 04/08/18   Domenick Gong, MD  olopatadine (PATANOL) 0.1 % ophthalmic solution Place 1 drop into both eyes 2 (two) times daily. 04/08/18   Domenick Gong, MD  oxyCODONE-acetaminophen (PERCOCET) 7.5-325 MG tablet Take 1 tablet by mouth every 6 (six) hours as needed. 05/29/18   Joni Reining, PA-C    Allergies Azithromycin; Flonase [fluticasone]; and Topamax [topiramate]  Family History  Problem Relation Age of Onset  . Cardiomyopathy Father   . Migraines Mother   . Breast cancer Maternal Grandmother 73    Social History Social History   Tobacco Use  . Smoking status: Never Smoker  . Smokeless tobacco: Never Used  Substance Use Topics  . Alcohol use: Yes    Alcohol/week: 0.0 standard drinks    Comment: rarely  . Drug use: No    Review of Systems Constitutional: No fever/chills Eyes: No visual changes. ENT: No sore throat. Cardiovascular: Denies chest pain. Respiratory: Denies shortness of breath. Gastrointestinal: No abdominal pain.  No nausea, no vomiting.  No diarrhea.  No constipation. Genitourinary: Negative for dysuria. Musculoskeletal: Left wrist pain.. Skin: Negative for rash. Neurological: Negative for headaches, focal weakness or numbness. Allergic/Immunilogical: See medication list. ____________________________________________   PHYSICAL EXAM:  VITAL SIGNS: ED Triage Vitals [05/29/18 1459]  Enc Vitals Group     BP (!) 164/108     Pulse Rate 94  Resp 16     Temp 97.9 F (36.6 C)     Temp Source Oral     SpO2 99 %     Weight 225 lb (102.1 kg)     Height 5\' 2"  (1.575 m)     Head Circumference      Peak Flow      Pain Score 8     Pain Loc      Pain Edu?      Excl. in GC?     Constitutional: Alert and oriented. Well appearing and in no acute distress. Cardiovascular: Normal rate, regular rhythm. Grossly normal  heart sounds.  Good peripheral circulation.  Elevated blood pressure Respiratory: Normal respiratory effort.  No retractions. Lungs CTAB. Gastrointestinal: Soft and nontender. No distention. No abdominal bruits. No CVA tenderness. Musculoskeletal: No obvious deformity to the left wrist.  Patient has full equal range of motion.  . Neurologic:  Normal speech and language. No gross focal neurologic deficits are appreciated. No gait instability. Skin:  Skin is warm, dry and intact. No rash noted. Psychiatric: Mood and affect are normal. Speech and behavior are normal.  ____________________________________________   LABS (all labs ordered are listed, but only abnormal results are displayed)  Labs Reviewed - No data to display ____________________________________________  EKG   ____________________________________________  RADIOLOGY  ED MD interpretation:    Official radiology report(s): Dg Wrist Complete Left  Result Date: 05/29/2018 CLINICAL DATA:  Left wrist pain, recent fall EXAM: LEFT WRIST - COMPLETE 3+ VIEW COMPARISON:  None. FINDINGS: The left radiocarpal joint space appears normal and the ulnar styloid is intact. The carpal bones are normal position with normal alignment. No acute abnormality is seen. IMPRESSION: Negative. Electronically Signed   By: Dwyane DeePaul  Barry M.D.   On: 05/29/2018 15:38    ____________________________________________   PROCEDURES  Procedure(s) performed: None  Procedures  Critical Care performed: No  ____________________________________________   INITIAL IMPRESSION / ASSESSMENT AND PLAN / ED COURSE  As part of my medical decision making, I reviewed the following data within the electronic MEDICAL RECORD NUMBER    Nontraumatic right wrist pain.  Discussed negative x-ray findings with patient.  Patient given discharge care instruction.  Patient advised to continue wearing a wrist splint and follow-up with schedule orthopedic appointment in 2 days.   Take medication as needed for pain.      ____________________________________________   FINAL CLINICAL IMPRESSION(S) / ED DIAGNOSES  Final diagnoses:  Acute pain of left wrist     ED Discharge Orders         Ordered    oxyCODONE-acetaminophen (PERCOCET) 7.5-325 MG tablet  Every 6 hours PRN     05/29/18 1606           Note:  This document was prepared using Dragon voice recognition software and may include unintentional dictation errors.    Joni ReiningSmith, Andrez Lieurance K, PA-C 05/29/18 1615    Rockne MenghiniNorman, Anne-Caroline, MD 05/30/18 [id number]

## 2018-05-29 NOTE — ED Notes (Signed)
See triage note  Presents with left wrist pain  States she developed pain about 2 weeks ago  [facility]  Was given 2 injections and placed in splint   States pain increased over the weekend  Also had a fall couple of days ago  Pain is now increased  No swelling or deformity noted  Good pulses  States pain in mainly lateral

## 2018-05-29 NOTE — Discharge Instructions (Signed)
Your x-ray was negative for any definitive bony abnormalities.  Wear wrist support and take medication pending further evaluation by orthopedics.

## 2018-05-29 NOTE — ED Triage Notes (Signed)
Pt c/o LFT wrist pain, [facility] and was dx with tendinitis. PT states no relief with steroids. NAD noted

## 2018-06-19 ENCOUNTER — Ambulatory Visit
Admission: RE | Admit: 2018-06-19 | Discharge: 2018-06-19 | Disposition: A | Discharge status: Home / Self Care | Payer: Managed Care, Other (non HMO) | Source: Ambulatory Visit | Attending: Emergency Medicine | Admitting: Emergency Medicine

## 2018-06-19 ENCOUNTER — Ambulatory Visit: Payer: Medicaid Other | Admitting: Emergency Medicine

## 2018-06-19 ENCOUNTER — Other Ambulatory Visit: Payer: Self-pay | Admitting: Emergency Medicine

## 2018-06-19 ENCOUNTER — Ambulatory Visit
Admission: RE | Admit: 2018-06-19 | Discharge: 2018-06-19 | Disposition: A | Discharge status: Home / Self Care | Payer: Managed Care, Other (non HMO) | Attending: Emergency Medicine | Admitting: Emergency Medicine

## 2018-06-19 ENCOUNTER — Encounter: Payer: Self-pay | Admitting: Emergency Medicine

## 2018-06-19 VITALS — BP 122/74 | HR 88 | Temp 98.4°F | Resp 16

## 2018-06-19 DIAGNOSIS — J45909 Unspecified asthma, uncomplicated: Secondary | ICD-10-CM | POA: Insufficient documentation

## 2018-06-19 DIAGNOSIS — R102 Pelvic and perineal pain: Secondary | ICD-10-CM | POA: Insufficient documentation

## 2018-06-19 DIAGNOSIS — R011 Cardiac murmur, unspecified: Secondary | ICD-10-CM | POA: Insufficient documentation

## 2018-06-19 DIAGNOSIS — H65112 Acute and subacute allergic otitis media (mucoid) (sanguinous) (serous), left ear: Secondary | ICD-10-CM

## 2018-06-19 DIAGNOSIS — G43909 Migraine, unspecified, not intractable, without status migrainosus: Secondary | ICD-10-CM | POA: Insufficient documentation

## 2018-06-19 DIAGNOSIS — N941 Unspecified dyspareunia: Secondary | ICD-10-CM | POA: Insufficient documentation

## 2018-06-19 DIAGNOSIS — R05 Cough: Secondary | ICD-10-CM | POA: Diagnosis not present

## 2018-06-19 DIAGNOSIS — K589 Irritable bowel syndrome without diarrhea: Secondary | ICD-10-CM | POA: Insufficient documentation

## 2018-06-19 DIAGNOSIS — R0609 Other forms of dyspnea: Secondary | ICD-10-CM

## 2018-06-19 DIAGNOSIS — M25569 Pain in unspecified knee: Secondary | ICD-10-CM | POA: Insufficient documentation

## 2018-06-19 DIAGNOSIS — J209 Acute bronchitis, unspecified: Secondary | ICD-10-CM

## 2018-06-19 DIAGNOSIS — Z87891 Personal history of nicotine dependence: Secondary | ICD-10-CM | POA: Insufficient documentation

## 2018-06-19 DIAGNOSIS — R058 Other specified cough: Secondary | ICD-10-CM

## 2018-06-19 MED ORDER — HYDROCOD POLST-CPM POLST ER 10-8 MG/5ML PO SUER: 5.0000 mL | Freq: Every evening | ORAL | 0 refills | Status: DC | PRN

## 2018-06-19 MED ORDER — ALBUTEROL SULFATE HFA 108 (90 BASE) MCG/ACT IN AERS: 2.0000 | INHALATION_SPRAY | RESPIRATORY_TRACT | 0 refills | Status: DC | PRN

## 2018-06-19 MED ORDER — LEVOFLOXACIN 500 MG PO TABS: ORAL_TABLET | ORAL | 0 refills | Status: DC

## 2018-06-19 MED ORDER — METHYLPREDNISOLONE 4 MG PO TBPK: ORAL_TABLET | ORAL | 0 refills | Status: DC

## 2018-06-19 NOTE — Progress Notes (Addendum)
Patient ID: [name] [name], female   DOB: [birth year], 32 y.o.   MRN: [id number]   Started with URI symptoms 2 weeks ago.    -Seen at [facility] primary care [place] 06/12/2018, treated with Tessalon for cough and Azelastine nasal spray Was seen for follow-up 3 days later at [facility] primary care [place] on 06/15/2018.   Notes reviewed and pasted from 12/6:"-Patient's rapid strep and flu were negative in clinic -Given patient with intermittent expiratory wheezing and rhonchi on exam and fever, will treat bronchitis with prednisone 20 mg daily for 5 days and Augmentin twice daily for 7 days. Informed the patient that she could have a persistent dry cough up to 4 weeks given airway irritation."  Other symptomatic care was advised. Just completed prednisone, and she is on day 4 of Augmentin,  She presents today because she feels no better, worsening "raspy hacking uncomfortable" cough, productive of yellow-green sputum.  Cough kept her up all night the past couple of nights.  She has bilateral anterior right and left chest wall pain only when she coughs, not with other activities.  Might have wheezing at times, not currently.  Denies shortness of breath or syncope or palpitations.  Minimal facial/sinus pressure, no discolored nasal mucus  Allergies  Allergen Reactions  . Azithromycin Hives, Shortness Of Breath and Rash  . Flonase [Fluticasone Propionate] Anaphylaxis    Can use Nasonex without problems or reaction. Can take prednisone without reaction or problems.  Aleda Grana [Fluticasone]   . Topamax [Topiramate] Shortness Of Breath    Dizziness   Of note, she can tolerate Nasonex without any problems.  Symptoms:  + Fever prodrome.  Fever resolved a week ago. + Minimal swollen neck glands + Minimal sinus Headache + Moderate left ear pressure and decreased hearing.    No sneezing No significant Sore Throat No eye symptoms    No shortness of breath  No Abdominal Pain No Nausea No Vomiting No  diarrhea, except one loose stool nonbloody after starting Augmentin.  No Myalgias No focal neurologic symptoms No syncope No Rash No Urinary symptoms  Remainder of Review of Systems negative except as noted in the HPI.  Blood pressure 122/74, pulse 88, temperature 98.4 F (36.9 C), temperature source Oral, resp. rate 16, last menstrual period 01/16/2017, SpO2 100 %.   PHYSICAL EXAM Constitutional: Oriented to person, place, and time.  Appears well-developed and well-nourished. No distress.  Hacking cough noted. HENT:  Head: Normocephalic and atraumatic.  Right Ear: Tympanic membrane, external ear and ear canal normal.  Left Ear: TM dull, mildly red, with cloudy air-fluid level behind TM.  Landmarks dulled.  No perforation.  Canal normal. Nose: Minimal mucosal edema and rhinorrhea present.  Maxillary sinuses nontender to palpation. Mouth/Throat: Oropharynx is clear and moist. No oral lesions. No oropharyngeal exudate.  Eyes: Right eye exhibits no discharge. Left eye exhibits no discharge. No scleral icterus.  Neck: Neck supple.  No adenopathy. Cardiovascular: Normal rate, regular rhythm and normal heart sounds.  No murmur Pulmonary/Chest: Effort normal. No respiratory distress.  Rhonchi present throughout.   Minimal late expiratory wheeze heard only on forced expiration, heard in the anterior lung fields. Breath sounds equal bilaterally.  Good air exchange.  O2 saturation 100% on room air. No rales. Neurological: Alert and oriented to person, place, and time.  Cranial nerves intact. Skin: Skin is warm and dry. No rash noted.  Nursing note and vitals reviewed.   10:18 AM.  Initial assessment is worsening productive cough with rhonchi  and few wheezes and left otitis media.  Not improved after 5 days of prednisone and Augmentin. Chest x-ray ordered.  Patient agrees CXR FINDINGS: Mild peribronchial thickening. Heart and mediastinal contours are within normal limits. No focal  opacities or effusions. No acute bony abnormality.  IMPRESSION: Mild bronchitic changes.  Electronically Signed     By: Charlett NoseKevin Dover M.D. On: 06/19/2018 10:44  Assessment: Reviewed chest x-ray shows mild bronchitic changes, but no pneumonia or other acute abnormalities. Diagnosis is subacute bronchitis/subacute asthma exacerbation, left otitis media.  With persistent severe hacking cough.   Plan: Treatment options discussed at length, as well as risks, benefits, alternatives. She declined IM injection of Depo-Medrol today. Patient voiced understanding and agreement with the following plans:  New Prescriptions   ALBUTEROL (PROVENTIL HFA;VENTOLIN HFA) 108 (90 BASE) MCG/ACT INHALER    Inhale 2 puffs into the lungs every 4 (four) hours as needed for wheezing or shortness of breath.   CHLORPHENIRAMINE-HYDROCODONE (TUSSIONEX PENNKINETIC ER) 10-8 MG/5ML SUER    Take 5 mLs by mouth at bedtime as needed. For severe cough.  Caution: May cause drowsiness   LEVOFLOXACIN (LEVAQUIN) 500 MG TABLET    Take 1 tablet daily for 7 days.   METHYLPREDNISOLONE (MEDROL DOSEPAK) 4 MG TBPK TABLET    Take as directed for 6 days   An After Visit Summary was printed and given to the patient.-We reviewed AVS.  Verbal instructions also.-Urged to make appointment today for follow-up with PCP in 1 week. Precautions discussed. Red flags discussed.-Discussed need to seek medical care for red flags or any severe worsening problems. Questions invited and answered. Patient voiced understanding and agreement.  Note, I reviewed PMP aware website found patients narcotic score was 280, mainly because of oxycodone prescriptions for musculoskeletal problems. She states that she did not take most of those prescriptions and has thrown them away. After evaluating all the information I have today, it is my opinion that the benefits of small prescription of Tussionex 70 mL's out way the risks.-Precautions discussed at  length.

## 2018-06-19 NOTE — Patient Instructions (Addendum)
Chest x-ray shows bronchitis, but no pneumonia. Your cough is likely a combination of partially treated bronchitis infection and inflammation.  You also have left middle ear infection. Prescriptions have been electronically prescribed to your pharmacy.  Take medications as prescribed. Today, call and make an appointment with your PCP for a follow-up appointment in 1 week. If any severe worsening symptoms, follow-up sooner or go to emergency room.  Please read attached information.  Acute Bronchitis, Adult Acute bronchitis is sudden (acute) swelling of the air tubes (bronchi) in the lungs. Acute bronchitis causes these tubes to fill with mucus, which can make it hard to breathe. It can also cause coughing or wheezing. In adults, acute bronchitis usually goes away within 2 weeks. A cough caused by bronchitis may last up to 3 weeks. Smoking, allergies, and asthma can make the condition worse. Repeated episodes of bronchitis may cause further lung problems, such as chronic obstructive pulmonary disease (COPD). What are the causes? This condition can be caused by germs and by substances that irritate the lungs, including:  Cold and flu viruses. This condition is most often caused by the same virus that causes a cold.  Bacteria.  Exposure to tobacco smoke, dust, fumes, and air pollution.  What increases the risk? This condition is more likely to develop in people who:  Have close contact with someone with acute bronchitis.  Are exposed to lung irritants, such as tobacco smoke, dust, fumes, and vapors.  Have a weak immune system.  Have a respiratory condition such as asthma.  What are the signs or symptoms? Symptoms of this condition include:  A cough.  Coughing up clear, yellow, or green mucus.  Wheezing.  Chest congestion.  Shortness of breath.  A fever.  Body aches.  Chills.  A sore throat.  How is this diagnosed? This condition is usually diagnosed with a physical  exam. During the exam, your health care provider may order tests, such as chest X-rays, to rule out other conditions. He or she may also:  Test a sample of your mucus for bacterial infection.  Check the level of oxygen in your blood. This is done to check for pneumonia.  Do a chest X-ray or lung function testing to rule out pneumonia and other conditions.  Perform blood tests.  Your health care provider will also ask about your symptoms and medical history. How is this treated? Most cases of acute bronchitis clear up over time without treatment. Your health care provider may recommend:  Drinking more fluids. Drinking more makes your mucus thinner, which may make it easier to breathe.  Taking a medicine for a fever or cough.  Taking an antibiotic medicine.  Using an inhaler to help improve shortness of breath and to control a cough.  Using a cool mist vaporizer or humidifier to make it easier to breathe.  Follow these instructions at home: Medicines  Take over-the-counter and prescription medicines only as told by your health care provider.  If you were prescribed an antibiotic, take it as told by your health care provider. Do not stop taking the antibiotic even if you start to feel better. General instructions  Get plenty of rest.  Drink enough fluids to keep your urine clear or pale yellow.  Avoid smoking and secondhand smoke. Exposure to cigarette smoke or irritating chemicals will make bronchitis worse. If you smoke and you need help quitting, ask your health care provider. Quitting smoking will help your lungs heal faster.  Use an inhaler, cool mist  vaporizer, or humidifier as told by your health care provider.  Keep all follow-up visits as told by your health care provider. This is important. How is this prevented? To lower your risk of getting this condition again:  Wash your hands often with soap and water. If soap and water are not available, use hand  sanitizer.  Avoid contact with people who have cold symptoms.  Try not to touch your hands to your mouth, nose, or eyes.  Make sure to get the flu shot every year.  Contact a health care provider if:  Your symptoms do not improve in 2 weeks of treatment. Get help right away if:  You cough up blood.  You have chest pain.  You have severe shortness of breath.  You become dehydrated.  You faint or keep feeling like you are going to faint.  You keep vomiting.  You have a severe headache.  Your fever or chills gets worse. This information is not intended to replace advice given to you by your health care provider. Make sure you discuss any questions you have with your health care provider. Document Released: 08/04/2004 Document Revised: 01/20/2016 Document Reviewed: 12/16/2015 Elsevier Interactive Patient Education  2018 [employer].  Otitis Media, Adult Otitis media occurs when there is inflammation and fluid in the middle ear. Your middle ear is a part of the ear that contains bones for hearing as well as air that helps send sounds to your brain. What are the causes? This condition is caused by a blockage in the eustachian tube. This tube drains fluid from the ear to the back of the nose (nasopharynx). A blockage in this tube can be caused by an object or by swelling (edema) in the tube. Problems that can cause a blockage include:  A cold or other upper respiratory infection.  Allergies.  An irritant, such as tobacco smoke.  Enlarged adenoids. The adenoids are areas of soft tissue located high in the back of the throat, behind the nose and the roof of the mouth.  A mass in the nasopharynx.  Damage to the ear caused by pressure changes (barotrauma).  What are the signs or symptoms? Symptoms of this condition include:  Ear pain.  A fever.  Decreased hearing.  A headache.  Tiredness (lethargy).  Fluid leaking from the ear.  Ringing in the ear.  How is  this diagnosed? This condition is diagnosed with a physical exam. During the exam your health care provider will use an instrument called an otoscope to look into your ear and check for redness, swelling, and fluid. He or she will also ask about your symptoms. Your health care provider may also order tests, such as:  A test to check the movement of the eardrum (pneumatic otoscopy). This test is done by squeezing a small amount of air into the ear.  A test that changes air pressure in the middle ear to check how well the eardrum moves and whether the eustachian tube is working (tympanogram).  How is this treated? This condition usually goes away on its own within 3-5 days. But if the condition is caused by a bacteria infection and does not go away own its own, or keeps coming back, your health care provider may:  Prescribe antibiotic medicines to treat the infection.  Prescribe or recommend medicines to control pain.  Follow these instructions at home:  Take over-the-counter and prescription medicines only as told by your health care provider.  If you were prescribed an antibiotic  medicine, take it as told by your health care provider. Do not stop taking the antibiotic even if you start to feel better.  Keep all follow-up visits as told by your health care provider. This is important. Contact a health care provider if:  You have bleeding from your nose.  There is a lump on your neck.  You are not getting better in 5 days.  You feel worse instead of better. Get help right away if:  You have severe pain that is not controlled with medicine.  You have swelling, redness, or pain around your ear.  You have stiffness in your neck.  A part of your face is paralyzed.  The bone behind your ear (mastoid) is tender when you touch it.  You develop a severe headache. Summary  Otitis media is redness, soreness, and swelling of the middle ear.  This condition usually goes away on its  own within 3-5 days.  If the problem does not go away in 3-5 days, your health care provider may prescribe or recommend medicines to treat your symptoms.  If you were prescribed an antibiotic medicine, take it as told by your health care provider. This information is not intended to replace advice given to you by your health care provider. Make sure you discuss any questions you have with your health care provider.

## 2018-07-26 NOTE — H&P (Signed)
[name] is a 33 y.o. female here for Dyspareunia .pt is s/p LAVG and bilat salpingectomy . Dx of endometriosis ( biopsy proven ) . Pain before with chronic cyclic pain and dyspareunia . Now 10 month out from surgery just deep thrust dyspareunia . Pt with 4 month of Lupron without significant change of pain so stopped. Now just dyspareunia . U/S 4 months ago failed to show adhesions to the cuff.      Past Medical History:  has a past medical history of Abnormal Pap smear, Asthma without status asthmaticus, unspecified, Cardiac murmur, Cervicalgia, Depression, Exertional dyspnea, Former tobacco use, IBS (irritable bowel syndrome), and Migraine.  Past Surgical History:  has a past surgical history that includes Tonsillectomy; Knee arthroscopy (2004); Hysterectomy Vaginal Laparoscopic Assisted (09/11/2016); Hysterectomy (09/11/2017); and Salpingectomy (Bilateral, 09/11/2017). Family History: family history includes Asthma in her sister; Breast cancer in her maternal grandmother; Cardiomyopathy (Abnormal function of the heart muscle) in her father; Coronary Artery Disease (Blocked arteries around heart) in her father; Depression in her mother; Diabetes type II in her paternal aunt and paternal grandmother. Social History:  reports that she quit smoking about 5 years ago. Her smoking use included cigarettes. She has a 3.00 pack-year smoking history. She has never used smokeless tobacco. She reports previous alcohol use of about 1.0 - 3.0 standard drinks of alcohol per week. She reports that she does not use drugs. OB/GYN History:          OB History    Gravida  2   Para  2   Term      Preterm      AB      Living  2     SAB      TAB      Ectopic      Molar      Multiple      Live Births  2          Allergies: is allergic to flonase [fluticasone propionate]; topamax [topiramate]; and zithromax [azithromycin]. Medications:  Current Outpatient Medications:  .   azelastine (ASTELIN) 137 mcg nasal spray, Place 1 spray into both nostrils 2 (two) times daily, Disp: 30 mL, Rfl: 11 .  ibuprofen (ADVIL,MOTRIN) 800 MG tablet, Take 1 tablet (800 mg total) by mouth every 8 (eight) hours as needed for Pain, Disp: 60 tablet, Rfl: 1 .  predniSONE (DELTASONE) 20 MG tablet, Take 1 tablet (20 mg total) by mouth 2 (two) times daily For three days then one tablet daily for three days (Patient not taking: Reported on 07/23/2018), Disp: 9 tablet, Rfl: 0  Review of Systems: General:                      No fatigue or weight loss Eyes:                           No vision changes Ears:                            No hearing difficulty Respiratory:                No cough or shortness of breath Pulmonary:                  No asthma or shortness of breath Cardiovascular:           No chest pain, palpitations,  dyspnea on exertion Gastrointestinal:          No abdominal bloating, chronic diarrhea, constipations, masses, pain or hematochezia Genitourinary:             No hematuria, dysuria, abnormal vaginal discharge, pelvic pain, Menometrorrhagia Lymphatic:                   No swollen lymph nodes Musculoskeletal:         No muscle weakness Neurologic:                  No extremity weakness, syncope, seizure disorder Psychiatric:                  No history of depression, delusions or suicidal/homicidal ideation    Exam:      Vitals:   07/23/18 1606  BP: 128/76  Pulse: 82    Body mass index is 41.52 kg/m.  WDWN white/  female in NAD   Lungs: CTA  CV : RRR without murmur    Neck:  no thyromegaly Abdomen: soft , no mass, normal active bowel sounds,  non-tender, no rebound tenderness Pelvic: tanner stage 5 ,  External genitalia: vulva /labia no lesions Urethra: no prolapse Vagina: normal physiologic d/c, cuff none tender  Cervix: absent  Uterus: absent Adnexa: no mass,  non-tender   Rectovaginal:   Impression:   The primary encounter diagnosis  was Dyspareunia, female. A diagnosis of Endometriosis of pelvis was also pertinent to this visit.    Plan:   Possible flare of endometriosis vs pelvic adhesions.  Offered a operative L/S with possible LOA, excision of endometriosis. She is willing to proceed .  ultimately she should be on continuous birth control to treat her endometriosis     Vilma Prader, MD       Electronically signed by Kylah Maresh, Sabas Sous, MD on 07/23/2018

## 2018-07-27 ENCOUNTER — Encounter
Admission: RE | Admit: 2018-07-27 | Discharge: 2018-07-27 | Disposition: A | Discharge status: Home / Self Care | Payer: Managed Care, Other (non HMO) | Source: Ambulatory Visit | Attending: Obstetrics and Gynecology | Admitting: Obstetrics and Gynecology

## 2018-07-27 ENCOUNTER — Other Ambulatory Visit: Payer: Self-pay

## 2018-07-27 DIAGNOSIS — Z01812 Encounter for preprocedural laboratory examination: Secondary | ICD-10-CM | POA: Diagnosis present

## 2018-07-27 HISTORY — DX: Unspecified abdominal pain: R10.9

## 2018-07-27 LAB — CBC
HCT: 40.7 % (ref 36.0–46.0)
Hemoglobin: 12.7 g/dL (ref 12.0–15.0)
MCH: 26.5 pg (ref 26.0–34.0)
MCHC: 31.2 g/dL (ref 30.0–36.0)
MCV: 85 fL (ref 80.0–100.0)
Platelets: 256 10*3/uL (ref 150–400)
RBC: 4.79 MIL/uL (ref 3.87–5.11)
RDW: 14.3 % (ref 11.5–15.5)
WBC: 6.1 10*3/uL (ref 4.0–10.5)
nRBC: 0 % (ref 0.0–0.2)

## 2018-07-27 LAB — BASIC METABOLIC PANEL
Anion gap: 7 (ref 5–15)
BUN: 13 mg/dL (ref 6–20)
CALCIUM: 9.2 mg/dL (ref 8.9–10.3)
CO2: 25 mmol/L (ref 22–32)
Chloride: 109 mmol/L (ref 98–111)
Creatinine, Ser: 0.68 mg/dL (ref 0.44–1.00)
GFR calc Af Amer: >60 mL/min (ref >60)
GFR calc non Af Amer: >60 mL/min (ref >60)
GLUCOSE: 89 mg/dL (ref 70–99)
Potassium: 4 mmol/L (ref 3.5–5.1)
Sodium: 141 mmol/L (ref 135–145)

## 2018-07-27 LAB — TYPE AND SCREEN
ABO/RH(D): O POS
Antibody Screen: NEGATIVE

## 2018-07-27 NOTE — Patient Instructions (Addendum)
Your procedure is scheduled on: 08/03/18 Fri Report to Same Day Surgery 2nd floor medical mall [facility] Entrance-take elevator on left to 2nd floor.  Check in with surgery information desk.) To find out your arrival time please call [phone] between 1PM - 3PM on 08/02/18 Thurs  Remember: Instructions that are not followed completely may result in serious medical risk, up to and including death, or upon the discretion of your surgeon and anesthesiologist your surgery may need to be rescheduled.    _x___ 1. Do not eat food after midnight the night before your procedure. You may drink clear liquids up to 2 hours before you are scheduled to arrive at the hospital for your procedure.  Do not drink clear liquids within 2 hours of your scheduled arrival to the hospital.  Clear liquids include  --Water or Apple juice without pulp  --Clear carbohydrate beverage such as ClearFast or Gatorade  --Black Coffee or Clear Tea (No milk, no creamers, do not add anything to                  the coffee or Tea Type 1 and type 2 diabetics should only drink water.   ____Ensure clear carbohydrate drink on the way to the hospital for bariatric patients  _x___Ensure clear carbohydrate drink 3 hours before surgery  No gum chewing or hard candies.     __x__ 2. No Alcohol for 24 hours before or after surgery.   __x__3. No Smoking or e-cigarettes for 24 prior to surgery.  Do not use any chewable tobacco products for at least 6 hour prior to surgery   ____  4. Bring all medications with you on the day of surgery if instructed.    __x__ 5. Notify your doctor if there is any change in your medical condition     (cold, fever, infections).    x___6. On the morning of surgery brush your teeth with toothpaste and water.  You may rinse your mouth with mouth wash if you wish.  Do not swallow any toothpaste or mouthwash.   Do not wear jewelry, make-up, hairpins, clips or nail polish.  Do not wear lotions, powders,  or perfumes. You may wear deodorant.  Do not shave 48 hours prior to surgery. Men may shave face and neck.  Do not bring valuables to the hospital.    [facility] is not responsible for any belongings or valuables.               Contacts, dentures or bridgework may not be worn into surgery.  Leave your suitcase in the car. After surgery it may be brought to your room.  For patients admitted to the hospital, discharge time is determined by your                       treatment team.  _  Patients discharged the day of surgery will not be allowed to drive home.  You will need someone to drive you home and stay with you the night of your procedure.    Please read over the following fact sheets that you were given:   [facility] Preparing for Surgery and or MRSA Information   _x___ Take anti-hypertensive listed below, cardiac, seizure, asthma,     anti-reflux and psychiatric medicines. These include:  1. none  2.  3.  4.  5.  6.  ____Fleets enema or Magnesium Citrate as directed.   _x___ Use CHG Soap or sage  wipes as directed on instruction sheet   ____ Use inhalers on the day of surgery and bring to hospital day of surgery  ____ Stop Metformin and Janumet 2 days prior to surgery.    ____ Take 1/2 of usual insulin dose the night before surgery and none on the morning     surgery.   _x___ Follow recommendations from Cardiologist, Pulmonologist or PCP regarding          stopping Aspirin, Coumadin, Plavix ,Eliquis, Effient, or Pradaxa, and Pletal.  X____Stop Anti-inflammatories such as Advil, Aleve, Ibuprofen, Motrin, Naproxen, Naprosyn, Goodies powders or aspirin products. OK to take Tylenol and                          Celebrex.   _x___ Stop supplements until after surgery.  But may continue Vitamin D, Vitamin B,       and multivitamin.   ____ Bring C-Pap to the hospital.

## 2018-07-27 NOTE — Pre-Procedure Instructions (Signed)
Carbohydrate drink given with instructions.  

## 2018-08-03 ENCOUNTER — Encounter: Payer: Self-pay | Admitting: Emergency Medicine

## 2018-08-03 ENCOUNTER — Encounter
Admission: RE | Disposition: A | Discharge status: Home / Self Care | Payer: Self-pay | Source: Home / Self Care | Attending: Obstetrics and Gynecology

## 2018-08-03 ENCOUNTER — Ambulatory Visit: Payer: Managed Care, Other (non HMO) | Admitting: Anesthesiology

## 2018-08-03 ENCOUNTER — Ambulatory Visit
Admission: RE | Admit: 2018-08-03 | Discharge: 2018-08-03 | Disposition: A | Discharge status: Home / Self Care | Payer: Managed Care, Other (non HMO) | Attending: Obstetrics and Gynecology | Admitting: Obstetrics and Gynecology

## 2018-08-03 ENCOUNTER — Other Ambulatory Visit: Payer: Self-pay

## 2018-08-03 DIAGNOSIS — Z87891 Personal history of nicotine dependence: Secondary | ICD-10-CM | POA: Insufficient documentation

## 2018-08-03 DIAGNOSIS — N736 Female pelvic peritoneal adhesions (postinfective): Secondary | ICD-10-CM | POA: Insufficient documentation

## 2018-08-03 DIAGNOSIS — N803 Endometriosis of pelvic peritoneum: Secondary | ICD-10-CM | POA: Diagnosis not present

## 2018-08-03 DIAGNOSIS — N941 Unspecified dyspareunia: Secondary | ICD-10-CM | POA: Diagnosis not present

## 2018-08-03 DIAGNOSIS — Z6841 Body Mass Index (BMI) 40.0 and over, adult: Secondary | ICD-10-CM | POA: Insufficient documentation

## 2018-08-03 HISTORY — PX: LYSIS OF ADHESION: SHX5961

## 2018-08-03 HISTORY — PX: LAPAROSCOPY: SHX197

## 2018-08-03 SURGERY — LAPAROSCOPY OPERATIVE
Anesthesia: General | Site: Abdomen

## 2018-08-03 MED ORDER — FENTANYL CITRATE (PF) 100 MCG/2ML IJ SOLN
INTRAMUSCULAR | Status: DC | PRN
  Administered 2018-08-03 (×2): 100 ug via INTRAVENOUS

## 2018-08-03 MED ORDER — LACTATED RINGERS IV SOLN
INTRAVENOUS | Status: DC
  Administered 2018-08-03 (×2): via INTRAVENOUS

## 2018-08-03 MED ORDER — BUPIVACAINE HCL (PF) 0.5 % IJ SOLN
INTRAMUSCULAR | Status: AC
  Filled 2018-08-03: qty 30

## 2018-08-03 MED ORDER — LACTATED RINGERS IV SOLN: INTRAVENOUS | Status: DC

## 2018-08-03 MED ORDER — PROMETHAZINE HCL 25 MG/ML IJ SOLN: 6.2500 mg | INTRAMUSCULAR | Status: DC | PRN

## 2018-08-03 MED ORDER — GABAPENTIN 300 MG PO CAPS
ORAL_CAPSULE | ORAL | Status: AC
  Administered 2018-08-03: 900 mg via ORAL
  Filled 2018-08-03: qty 3

## 2018-08-03 MED ORDER — BUPIVACAINE HCL 0.5 % IJ SOLN
INTRAMUSCULAR | Status: DC | PRN
  Administered 2018-08-03: 10 mL

## 2018-08-03 MED ORDER — ACETAMINOPHEN 500 MG PO TABS
1000.0000 mg | ORAL_TABLET | ORAL | Status: AC
  Administered 2018-08-03: 1000 mg via ORAL

## 2018-08-03 MED ORDER — PROPOFOL 10 MG/ML IV BOLUS
INTRAVENOUS | Status: AC
  Filled 2018-08-03: qty 20

## 2018-08-03 MED ORDER — SILVER NITRATE-POT NITRATE 75-25 % EX MISC
CUTANEOUS | Status: AC
  Filled 2018-08-03: qty 1

## 2018-08-03 MED ORDER — PROPOFOL 10 MG/ML IV BOLUS
INTRAVENOUS | Status: DC | PRN
  Administered 2018-08-03: 200 mg via INTRAVENOUS

## 2018-08-03 MED ORDER — ACETAMINOPHEN 500 MG PO TABS
ORAL_TABLET | ORAL | Status: AC
  Administered 2018-08-03: 1000 mg via ORAL
  Filled 2018-08-03: qty 2

## 2018-08-03 MED ORDER — DEXAMETHASONE SODIUM PHOSPHATE 10 MG/ML IJ SOLN
INTRAMUSCULAR | Status: DC | PRN
  Administered 2018-08-03: 10 mg via INTRAVENOUS

## 2018-08-03 MED ORDER — CELECOXIB 200 MG PO CAPS
400.0000 mg | ORAL_CAPSULE | ORAL | Status: AC
  Administered 2018-08-03: 400 mg via ORAL

## 2018-08-03 MED ORDER — PHENYLEPHRINE HCL 10 MG/ML IJ SOLN
INTRAMUSCULAR | Status: DC | PRN
  Administered 2018-08-03: 100 ug via INTRAVENOUS

## 2018-08-03 MED ORDER — HYDROMORPHONE HCL 1 MG/ML IJ SOLN: 0.2500 mg | INTRAMUSCULAR | Status: DC | PRN

## 2018-08-03 MED ORDER — ROCURONIUM BROMIDE 100 MG/10ML IV SOLN
INTRAVENOUS | Status: DC | PRN
  Administered 2018-08-03: 50 mg via INTRAVENOUS

## 2018-08-03 MED ORDER — MIDAZOLAM HCL 2 MG/2ML IJ SOLN
INTRAMUSCULAR | Status: DC | PRN
  Administered 2018-08-03: 2 mg via INTRAVENOUS

## 2018-08-03 MED ORDER — ACETAMINOPHEN 10 MG/ML IV SOLN
INTRAVENOUS | Status: AC
  Filled 2018-08-03: qty 100

## 2018-08-03 MED ORDER — SUGAMMADEX SODIUM 200 MG/2ML IV SOLN
INTRAVENOUS | Status: DC | PRN
  Administered 2018-08-03: 200 mg via INTRAVENOUS

## 2018-08-03 MED ORDER — GABAPENTIN 300 MG PO CAPS
900.0000 mg | ORAL_CAPSULE | ORAL | Status: AC
  Administered 2018-08-03: 900 mg via ORAL

## 2018-08-03 MED ORDER — FAMOTIDINE 20 MG PO TABS
ORAL_TABLET | ORAL | Status: AC
  Administered 2018-08-03: 20 mg via ORAL
  Filled 2018-08-03: qty 1

## 2018-08-03 MED ORDER — LIDOCAINE HCL (CARDIAC) PF 100 MG/5ML IV SOSY
PREFILLED_SYRINGE | INTRAVENOUS | Status: DC | PRN
  Administered 2018-08-03: 100 mg via INTRAVENOUS

## 2018-08-03 MED ORDER — ONDANSETRON HCL 4 MG/2ML IJ SOLN
INTRAMUSCULAR | Status: DC | PRN
  Administered 2018-08-03: 4 mg via INTRAVENOUS

## 2018-08-03 MED ORDER — FAMOTIDINE 20 MG PO TABS
20.0000 mg | ORAL_TABLET | Freq: Once | ORAL | Status: AC
  Administered 2018-08-03: 20 mg via ORAL

## 2018-08-03 MED ORDER — CELECOXIB 200 MG PO CAPS
ORAL_CAPSULE | ORAL | Status: AC
  Administered 2018-08-03: 400 mg via ORAL
  Filled 2018-08-03: qty 2

## 2018-08-03 MED ORDER — MIDAZOLAM HCL 2 MG/2ML IJ SOLN
INTRAMUSCULAR | Status: AC
  Filled 2018-08-03: qty 2

## 2018-08-03 MED ORDER — FENTANYL CITRATE (PF) 250 MCG/5ML IJ SOLN
INTRAMUSCULAR | Status: AC
  Filled 2018-08-03: qty 5

## 2018-08-03 MED ORDER — KETOROLAC TROMETHAMINE 30 MG/ML IJ SOLN
INTRAMUSCULAR | Status: DC | PRN
  Administered 2018-08-03: 30 mg via INTRAVENOUS

## 2018-08-03 SURGICAL SUPPLY — 38 items
ANCHOR TIS RET SYS 235ML (MISCELLANEOUS) ×3 IMPLANT
BLADE SURG SZ11 CARB STEEL (BLADE) ×3 IMPLANT
CANISTER SUCT 1200ML W/VALVE (MISCELLANEOUS) ×3 IMPLANT
CATH FOLEY 2WAY  5CC 16FR (CATHETERS) ×2
CATH ROBINSON RED A/P 16FR (CATHETERS) ×3 IMPLANT
CATH URTH 16FR FL 2W BLN LF (CATHETERS) ×1 IMPLANT
CHLORAPREP W/TINT 26ML (MISCELLANEOUS) ×3 IMPLANT
COVER WAND RF STERILE (DRAPES) ×3 IMPLANT
DERMABOND ADVANCED (GAUZE/BANDAGES/DRESSINGS) ×2
DERMABOND ADVANCED .7 DNX12 (GAUZE/BANDAGES/DRESSINGS) ×1 IMPLANT
GAUZE 4X4 16PLY RFD (DISPOSABLE) ×3 IMPLANT
GLOVE BIO SURGEON STRL SZ8 (GLOVE) ×6 IMPLANT
GOWN STRL REUS W/ TWL LRG LVL3 (GOWN DISPOSABLE) ×1 IMPLANT
GOWN STRL REUS W/ TWL XL LVL3 (GOWN DISPOSABLE) ×1 IMPLANT
GOWN STRL REUS W/TWL LRG LVL3 (GOWN DISPOSABLE) ×2
GOWN STRL REUS W/TWL XL LVL3 (GOWN DISPOSABLE) ×2
GRASPER SUT TROCAR 14GX15 (MISCELLANEOUS) ×3 IMPLANT
IRRIGATION STRYKERFLOW (MISCELLANEOUS) ×1 IMPLANT
IRRIGATOR STRYKERFLOW (MISCELLANEOUS) ×3
IV NS 1000ML (IV SOLUTION) ×2
IV NS 1000ML BAXH (IV SOLUTION) ×1 IMPLANT
KIT TURNOVER CYSTO (KITS) ×3 IMPLANT
LABEL OR SOLS (LABEL) ×3 IMPLANT
NS IRRIG 500ML POUR BTL (IV SOLUTION) ×3 IMPLANT
PACK GYN LAPAROSCOPIC (MISCELLANEOUS) ×3 IMPLANT
PAD OB MATERNITY 4.3X12.25 (PERSONAL CARE ITEMS) ×3 IMPLANT
PAD PREP 24X41 OB/GYN DISP (PERSONAL CARE ITEMS) ×3 IMPLANT
SCISSORS METZENBAUM CVD 33 (INSTRUMENTS) ×3 IMPLANT
SHEARS HARMONIC ACE PLUS 36CM (ENDOMECHANICALS) ×3 IMPLANT
SLEEVE ENDOPATH XCEL 5M (ENDOMECHANICALS) ×9 IMPLANT
SUT VIC AB 0 CT1 36 (SUTURE) ×3 IMPLANT
SUT VIC AB 2-0 UR6 27 (SUTURE) ×9 IMPLANT
SUT VIC AB 4-0 SH 27 (SUTURE) ×4
SUT VIC AB 4-0 SH 27XANBCTRL (SUTURE) ×2 IMPLANT
TROCAR ENDO BLADELESS 11MM (ENDOMECHANICALS) ×3 IMPLANT
TROCAR XCEL NON-BLD 5MMX100MML (ENDOMECHANICALS) ×3 IMPLANT
TROCAR XCEL UNIV SLVE 11M 100M (ENDOMECHANICALS) ×3 IMPLANT
TUBING INSUFFLATION (TUBING) ×3 IMPLANT

## 2018-08-03 NOTE — Brief Op Note (Signed)
08/03/2018  8:35 AM  PATIENT:  [name] [name]  33 y.o. female  PRE-OPERATIVE DIAGNOSIS:  Dyspareunia  POST-OPERATIVE DIAGNOSIS:  Dyspareunia Pelvic adhesive disease  PROCEDURE:  Procedure(s): LAPAROSCOPY OPERATIVE, (N/A) LYSIS OF ADHESION (N/A)- extensive  90 % of total operating time   SURGEON:  Surgeon(s) and Role:    * Schermerhorn, Ihor Austin, MD - Primary  PHYSICIAN ASSISTANT: scrub tech  ASSISTANTS: none   ANESTHESIA:   general  EBL:  5 mL   BLOOD ADMINISTERED:none  DRAINS: none   LOCAL MEDICATIONS USED:  MARCAINE     SPECIMEN:  No Specimen  DISPOSITION OF SPECIMEN:  N/A  COUNTS:  YES  TOURNIQUET:  * No tourniquets in log *  DICTATION: .Other Dictation: Dictation Number verbal  PLAN OF CARE: Discharge to home after PACU  PATIENT DISPOSITION:  PACU - hemodynamically stable.   Delay start of Pharmacological VTE agent (>24hrs) due to surgical blood loss or risk of bleeding: not applicable

## 2018-08-03 NOTE — Discharge Instructions (Signed)
AMBULATORY SURGERY  DISCHARGE INSTRUCTIONS   1) The drugs that you were given will stay in your system until tomorrow so for the next 24 hours you should not:  A) Drive an automobile B) Make any legal decisions C) Drink any alcoholic beverage   2) You may resume regular meals tomorrow.  Today it is better to start with liquids and gradually work up to solid foods.  You may eat anything you prefer, but it is better to start with liquids, then soup and crackers, and gradually work up to solid foods.   3) Please notify your doctor immediately if you have any unusual bleeding, trouble breathing, redness and pain at the surgery site, drainage, fever, or pain not relieved by medication.    4) Additional Instructions:        Please contact your physician with any problems or Same Day Surgery at [phone], Monday through Friday 6 am to 4 pm, or [place] at [facility] number at [phone].    Laparoscopic Lysis of Abdominal Adhesions, Care After This sheet gives you information about how to care for yourself after your procedure. Your health care provider may also give you more specific instructions. If you have problems or questions, contact your health care provider. What can I expect after the procedure? After the procedure, it is common to have some pain around your incisions. Follow these instructions at home: Incision care   Follow instructions from your health care provider about how to take care of your incisions. Make sure you: ? Wash your hands with soap and water before you change your bandage (dressing). If soap and water are not available, use hand sanitizer. ? Change your dressing as told by your health care provider. ? Leave stitches (sutures), skin glue, or adhesive strips in place. These skin closures may need to stay in place for 2 weeks or longer. If adhesive strip edges start to loosen and curl up, you may trim the loose edges. Do not remove adhesive  strips completely unless your health care provider tells you to do that.  Check your incision areas every day for signs of infection. Check for: ? Redness, swelling, or pain. ? Fluid or blood. ? Warmth. ? Pus or a bad smell.  Do not take baths, swim, or use a hot tub until your health care provider approves. Ask your health care provider if you may take showers. You may only be allowed to take sponge baths. Activity  Do not lift anything that is heavier than 10 lb (4.5 kg), or the limit that you are told, until your health care provider says that it is safe.  Return to your normal activities as told by your health care provider. Ask your health care provider what activities are safe for you.  Do not drive or use heavy machinery while taking prescription pain medicine. General instructions  Take over-the-counter and prescription medicines only as told by your health care provider.  After your procedure, eat only a little at a time at first. Start with liquids. As your appetite improves, gradually return to eating solid foods.  If you are taking prescription pain medicine, take actions to prevent or treat constipation. Your health care provider may recommend that you: ? Drink enough fluid to keep your urine pale yellow. ? Eat foods that are high in fiber, such as fresh fruits and vegetables, whole grains, and beans. ? Limit foods that are high in fat and processed sugars, such as fried or sweet foods. ?  Take an over-the-counter or prescription medicine for constipation.  Keep all follow-up visits as told by your health care provider. This is important. Contact a health care provider if:  You have a fever or chills.  You have redness, swelling, or pain at the site of your incisions.  You have fluid or blood coming from your incisions.  Your incisions feel warm to the touch.  You have pus or a bad smell coming from your incisions or the dressing.  Your pain gets worse.  You  have a cough.  You have nausea and vomiting that does not go away after 3 hours. Get help right away if you have:  Severe pain in your abdomen or your chest.  Shortness of breath.  Nausea or vomiting that is severe or keeps coming back. Summary  After laparoscopic lysis of abdominal adhesions, it is common to have some pain around your incisions.  Check your incision areas every day for signs of infection. Watch for redness, worsening pain, warmth, and drainage.  Be sure to keep all follow-up visits as told by your health care provider. This information is not intended to replace advice given to you by your health care provider. Make sure you discuss any questions you have with your health care provider. Document Released: 11/11/2014 Document Revised: 08/01/2017 Document Reviewed: 06/14/2017 Elsevier Interactive Patient Education  2019 [employer].

## 2018-08-03 NOTE — Progress Notes (Signed)
Pt ready for dx L/S possible LOA , excision of endometriosis All questions answered . Proceed

## 2018-08-03 NOTE — Anesthesia Procedure Notes (Signed)
Procedure Name: Intubation Performed by: Marcy Siren, CRNA Pre-anesthesia Checklist: Patient identified, Emergency Drugs available, Suction available, Patient being monitored and Timeout performed Patient Re-evaluated:Patient Re-evaluated prior to induction Oxygen Delivery Method: Circle system utilized Preoxygenation: Pre-oxygenation with 100% oxygen Induction Type: IV induction Ventilation: Mask ventilation without difficulty Laryngoscope Size: Mac and 3 Grade View: Grade I Tube type: Oral Laser Tube: Cuffed inflated with minimal occlusive pressure - saline Tube size: 7.0 mm Number of attempts: 1 Airway Equipment and Method: Stylet Placement Confirmation: ETT inserted through vocal cords under direct vision,  positive ETCO2,  CO2 detector and breath sounds checked- equal and bilateral Secured at: 22 cm Tube secured with: Tape Dental Injury: Teeth and Oropharynx as per pre-operative assessment  Comments: Grade 1 view with slight BURP maneuver

## 2018-08-03 NOTE — Transfer of Care (Signed)
Immediate Anesthesia Transfer of Care Note  Patient: [name] [name]  Procedure(s) Performed: LAPAROSCOPY OPERATIVE, (N/A Abdomen) LYSIS OF ADHESION (N/A Abdomen)  Patient Location: PACU  Anesthesia Type:General  Level of Consciousness: awake  Airway & Oxygen Therapy: Patient Spontanous Breathing  Post-op Assessment: Report given to RN  Post vital signs: stable  Last Vitals:  Vitals Value Taken Time  BP 99/54 08/03/2018  8:49 AM  Temp    Pulse 77 08/03/2018  8:49 AM  Resp 13 08/03/2018  8:49 AM  SpO2 92 % 08/03/2018  8:49 AM  Vitals shown include unvalidated device data.  Last Pain:  Vitals:   08/03/18 0626  TempSrc: Tympanic  PainSc: 0-No pain         Complications: No apparent anesthesia complications

## 2018-08-03 NOTE — Anesthesia Post-op Follow-up Note (Signed)
Anesthesia QCDR form completed.        

## 2018-08-03 NOTE — Op Note (Signed)
NAME: [name] [name], [place] MEDICAL RECORD [id number] ACCOUNT [id number] DATE OF BIRTH:25-Mar-1986 FACILITY: [facility] LOCATION: [facility] PHYSICIAN:THOMAS Cloyde Reams, MD  OPERATIVE REPORT  DATE OF PROCEDURE:  08/03/2018  PREOPERATIVE DIAGNOSIS:  Dyspareunia.  POSTOPERATIVE DIAGNOSES: 1.  Dyspareunia. 2.  Significant pelvic adhesive disease.  PROCEDURE:  Extensive pelvic adhesiolysis.  SURGEON:  Jennell Corner, MD  ANESTHESIA:  General endotracheal anesthesia.  INDICATIONS:  This is a 33 year old gravida 2, para 2.  The patient is 10 months out from a laparoscopic assisted vaginal hysterectomy and bilateral salpingectomy for chronic pelvic pain and diagnosis of endometriosis.  The patient over the last 6 months  has had significant deep thrust dyspareunia.  DESCRIPTION OF PROCEDURE:  After adequate general endotracheal anesthesia, the patient was placed in dorsal supine position with the legs in the [place] stirrups.  The patient's abdomen, perineum and vagina were prepped and draped in normal sterile  fashion.  Timeout was performed.  Straight catheterization of the bladder yielded 250 mL of clear urine.  A sponge stick was placed in the vaginal vault to be used for manipulation of the vaginal cuff during the procedure.  Gloves were changed.   Attention was directed to the patient's abdomen.  A 5 mm infraumbilical incision was made after injecting with 0.5% Marcaine.  A 5 mm laparoscope was advanced into the abdominal cavity under direct visualization.  The patient's abdomen was insufflated.   The patient was put in slight Trendelenburg position and a second port was placed in the left lower quadrant 3 cm medial to the left anterior iliac spine under direct visualization, the trocar was advanced.  A third port was placed in the right lower  quadrant, again 3 cm medial to the right anterior iliac spine.  Initial impression demonstrated multiple adhesions encasing the vaginal  cuff.  These adhesions included the left and right ovary and loops of bowel to the vaginal cuff.  Harmonic scalpel was  brought up to the operative field and for 90% of the operating time pelvic adhesiolysis took place with meticulous dissection, retracting bowel away from the cuff and removing all adhesions.  Upper abdomen appeared normal.  The patient's abdomen was  then deflated and all cannulas removed.  The skin incisions were closed with interrupted 4-0 Vicryl suture and Dermabond placed on the incisions.  COMPLICATIONS:  There were no complications.  ESTIMATED BLOOD LOSS:  Minimal.  URINE OUTPUT:  250 mL  INTRAOPERATIVE FLUIDS:  500 mL  The patient tolerated the procedure well and was taken to recovery room in good condition.  TN/NUANCE  D:08/03/2018 T:08/03/2018 JOB:[id number]

## 2018-08-03 NOTE — Anesthesia Preprocedure Evaluation (Addendum)
Anesthesia Evaluation  Patient identified by MRN, date of birth, ID band Patient awake    Reviewed: Allergy & Precautions, H&P , NPO status , Patient's Chart, lab work & pertinent test results  Airway Mallampati: III       Dental  (+) Chipped   Pulmonary neg shortness of breath, asthma , neg recent URI, former smoker,           Cardiovascular (-) angina+ Valvular Problems/Murmurs (murmur)      Neuro/Psych  Headaches, PSYCHIATRIC DISORDERS Anxiety Depression  Neuromuscular disease (lumbar radiculopathy)    GI/Hepatic negative GI ROS, Neg liver ROS,   Endo/Other  Morbid obesity  Renal/GU      Musculoskeletal   Abdominal   Peds  Hematology negative hematology ROS (+)   Anesthesia Other Findings Past Medical History: No date: Abdominal cramping No date: Asthma     Comment:  as a child No date: Headache     Comment:  migraines No date: Heart murmur     Comment:  asd murmur. saw cardiologist until age 70 and it was so               small. rechecked 6 years ago and all was fine  Past Surgical History: 2004: KNEE ARTHROSCOPY; Right 09/11/2017: LAPAROSCOPIC VAGINAL HYSTERECTOMY WITH SALPINGECTOMY;  Bilateral     Comment:  Procedure: LAPAROSCOPIC ASSISTED VAGINAL HYSTERECTOMY               WITH SALPINGECTOMY;  Surgeon: Schermerhorn, Ihor Austin, MD;              Location: [facility] ORS;  Service: Gynecology;  Laterality:               Bilateral; age 33: TONSILLECTOMY  BMI    Body Mass Index:  41.15 kg/m      Reproductive/Obstetrics negative OB ROS                           Anesthesia Physical Anesthesia Plan  ASA: III  Anesthesia Plan: General ETT   Post-op Pain Management:    Induction:   PONV Risk Score and Plan: 4 or greater and Ondansetron, Dexamethasone, Midazolam and Treatment may vary due to age or medical condition  Airway Management Planned:   Additional Equipment:   Intra-op  Plan:   Post-operative Plan:   Informed Consent: I have reviewed the patients History and Physical, chart, labs and discussed the procedure including the risks, benefits and alternatives for the proposed anesthesia with the patient or authorized representative who has indicated his/her understanding and acceptance.     Dental Advisory Given  Plan Discussed with: Anesthesiologist and CRNA  Anesthesia Plan Comments:        Anesthesia Quick Evaluation

## 2018-08-05 NOTE — Anesthesia Postprocedure Evaluation (Signed)
Anesthesia Post Note  Patient: Christiana FuchsMaleesa Paige Whitmore  Procedure(s) Performed: LAPAROSCOPY OPERATIVE, (N/A Abdomen) LYSIS OF ADHESION (N/A Abdomen)  Patient location during evaluation: PACU Anesthesia Type: General Level of consciousness: awake and alert Pain management: pain level controlled Vital Signs Assessment: post-procedure vital signs reviewed and stable Respiratory status: spontaneous breathing, nonlabored ventilation and respiratory function stable Cardiovascular status: blood pressure returned to baseline and stable Postop Assessment: no apparent nausea or vomiting Anesthetic complications: no     Last Vitals:  Vitals:   08/03/18 0931 08/03/18 0951  BP: 129/89 134/75  Pulse: 68 75  Resp: 18 20  Temp: (!) 36.1 C (!) 36.1 C  SpO2: 100% 100%    Last Pain:  Vitals:   08/03/18 0951  TempSrc:   PainSc: 0-No pain                 Jovita GammaKathryn L Fitzgerald

## 2018-08-06 NOTE — Addendum Note (Signed)
Addendum  created 08/06/18 1237 by Stormy Fabian, CRNA   Charge Capture section accepted

## 2018-10-04 ENCOUNTER — Telehealth: Payer: Managed Care, Other (non HMO) | Admitting: Physician Assistant

## 2018-10-04 DIAGNOSIS — R112 Nausea with vomiting, unspecified: Secondary | ICD-10-CM

## 2018-10-04 DIAGNOSIS — R6889 Other general symptoms and signs: Secondary | ICD-10-CM

## 2018-10-04 DIAGNOSIS — Z209 Contact with and (suspected) exposure to unspecified communicable disease: Secondary | ICD-10-CM

## 2018-10-04 MED ORDER — BENZONATATE 100 MG PO CAPS: 100.0000 mg | ORAL_CAPSULE | Freq: Three times a day (TID) | ORAL | 0 refills | Status: DC | PRN

## 2018-10-04 MED ORDER — ONDANSETRON HCL 4 MG PO TABS: 4.0000 mg | ORAL_TABLET | Freq: Three times a day (TID) | ORAL | 0 refills | Status: DC | PRN

## 2018-10-04 NOTE — Progress Notes (Signed)
E-Visit for Corona Virus Screening  Based on your current symptoms, you may very well have the virus, however your symptoms are mild. Currently, not all patients are being tested. If the symptoms are mild and there is not a known exposure, performing the test is not indicated.  Coronavirus disease 2019 (COVID-19) is a respiratory illness that can spread from person to person. The virus that causes COVID-19 is a new virus that was first identified in the country of [place] but is now found in multiple other countries and has spread to the [place].  Symptoms associated with the virus are mild to severe fever, cough, and shortness of breath. There is currently no vaccine to protect against COVID-19, and there is no specific antiviral treatment for the virus.   To be considered HIGH RISK for Coronavirus (COVID-19), you have to meet the following criteria:  . Traveled to [place], [place], [place], [place] or [place]; or in the [place] to [place], [place], [place], or [place]; and have fever, cough, and shortness of breath within the last 2 weeks of travel OR  . Been in close contact with a person diagnosed with COVID-19 within the last 2 weeks and have fever, cough, and shortness of breath  . IF YOU DO NOT MEET THESE CRITERIA, YOU ARE CONSIDERED LOW RISK FOR COVID-19.   It is vitally important that if you feel that you have an infection such as this virus or any other virus that you stay home and away from places where you may spread it to others.  You should self-quarantine for 14 days if you have symptoms that could potentially be coronavirus and avoid contact with people age 32 and older.   You can use medication such as A prescription cough medication called Tessalon Perles 100 mg. You may take 1-2 capsules every 8 hours as needed for cough and a prescription for zofran for nausea/vomiting. Increase fluids and follow a bland diet. You may also take acetaminophen (Tylenol) as needed for  fever.  Reduce your risk of any infection by using the same precautions used for avoiding the common cold or flu:  Marland Kitchen Wash your hands often with soap and warm water for at least 20 seconds.  If soap and water are not readily available, use an alcohol-based hand sanitizer with at least 60% alcohol.  . If coughing or sneezing, cover your mouth and nose by coughing or sneezing into the elbow areas of your shirt or coat, into a tissue or into your sleeve (not your hands). . Avoid shaking hands with others and consider head nods or verbal greetings only. . Avoid touching your eyes, nose, or mouth with unwashed hands.  . Avoid close contact with people who are sick. . Avoid places or events with large numbers of people in one location, like concerts or sporting events. . Carefully consider travel plans you have or are making. . If you are planning any travel outside or inside the [place], visit the CDC's Travelers' Health webpage for the latest health notices. . If you have some symptoms but not all symptoms, continue to monitor at home and seek medical attention if your symptoms worsen. . If you are having a medical emergency, call 911.  HOME CARE . Only take medications as instructed by your medical team. . Drink plenty of fluids and get plenty of rest. . A steam or ultrasonic humidifier can help if you have congestion.    GET HELP RIGHT AWAY IF: .  You develop worsening fever. . You become short of breath . You cough up blood. . Your symptoms become more severe . Vomiting is not easing up in the next 24 hours or if you are unable to keep fluid in.   MAKE SURE YOU   Understand these instructions.  Will watch your condition.  Will get help right away if you are not doing well or get worse.  Your e-visit answers were reviewed by a board certified advanced clinical practitioner to complete your personal care plan.  Depending on the condition, your plan could have included both over the counter  or prescription medications.  If there is a problem please reply once you have received a response from your provider. Your safety is important to [place].  If you have drug allergies check your prescription carefully.    You can use MyChart to ask questions about today's visit, request a non-urgent call back, or ask for a work or school excuse for 24 hours related to this e-Visit. If it has been greater than 24 hours you will need to follow up with your provider, or enter a new e-Visit to address those concerns. You will get an e-mail in the next two days asking about your experience.  I hope that your e-visit has been valuable and will speed your recovery. Thank you for using e-visits.

## 2018-10-04 NOTE — Progress Notes (Signed)
I have spent 5 minutes in review of e-visit questionnaire, review and updating patient chart, medical decision making and response to patient.   Nayzeth Altman Cody Farooq Petrovich, PA-C    

## 2019-09-05 DIAGNOSIS — J302 Other seasonal allergic rhinitis: Secondary | ICD-10-CM | POA: Insufficient documentation

## 2019-09-05 DIAGNOSIS — Z8659 Personal history of other mental and behavioral disorders: Secondary | ICD-10-CM | POA: Insufficient documentation

## 2019-09-05 DIAGNOSIS — Z8619 Personal history of other infectious and parasitic diseases: Secondary | ICD-10-CM | POA: Insufficient documentation

## 2019-12-30 DIAGNOSIS — R0789 Other chest pain: Secondary | ICD-10-CM | POA: Insufficient documentation

## 2019-12-31 DIAGNOSIS — G4733 Obstructive sleep apnea (adult) (pediatric): Secondary | ICD-10-CM | POA: Insufficient documentation

## 2020-02-24 DIAGNOSIS — G8929 Other chronic pain: Secondary | ICD-10-CM | POA: Insufficient documentation

## 2020-03-22 ENCOUNTER — Other Ambulatory Visit: Payer: Self-pay

## 2020-03-22 ENCOUNTER — Ambulatory Visit
Admission: EM | Admit: 2020-03-22 | Discharge: 2020-03-22 | Disposition: A | Discharge status: Home / Self Care | Payer: Medicaid Other | Attending: Emergency Medicine | Admitting: Emergency Medicine

## 2020-03-22 DIAGNOSIS — Z881 Allergy status to other antibiotic agents status: Secondary | ICD-10-CM | POA: Insufficient documentation

## 2020-03-22 DIAGNOSIS — R030 Elevated blood-pressure reading, without diagnosis of hypertension: Secondary | ICD-10-CM

## 2020-03-22 DIAGNOSIS — Z87891 Personal history of nicotine dependence: Secondary | ICD-10-CM | POA: Insufficient documentation

## 2020-03-22 DIAGNOSIS — Z20822 Contact with and (suspected) exposure to covid-19: Secondary | ICD-10-CM | POA: Insufficient documentation

## 2020-03-22 DIAGNOSIS — Z6841 Body Mass Index (BMI) 40.0 and over, adult: Secondary | ICD-10-CM | POA: Insufficient documentation

## 2020-03-22 DIAGNOSIS — J069 Acute upper respiratory infection, unspecified: Secondary | ICD-10-CM | POA: Diagnosis not present

## 2020-03-22 DIAGNOSIS — E669 Obesity, unspecified: Secondary | ICD-10-CM | POA: Insufficient documentation

## 2020-03-22 DIAGNOSIS — R05 Cough: Secondary | ICD-10-CM | POA: Diagnosis present

## 2020-03-22 LAB — GROUP A STREP BY PCR: Group A Strep by PCR: NOT DETECTED

## 2020-03-22 NOTE — ED Triage Notes (Signed)
Patient complains of cough, ear pain and sore throat x Friday.

## 2020-03-22 NOTE — ED Provider Notes (Signed)
[facility]    CSN: [id number] Arrival date & time: 03/22/20  1106      History   Chief Complaint Chief Complaint  Patient presents with  . Cough    HPI [name] [name] is a 34 y.o. female.   Patient presents with sore throat, ear fullness, nonproductive cough x2 days.  She denies fever, chills, rash, shortness of breath, vomiting, diarrhea, or other symptoms.  Treatment attempted at home with Tylenol.  The history is provided by the patient.    Past Medical History:  Diagnosis Date  . Abdominal cramping   . Asthma    as a child  . Headache    migraines  . Heart murmur    asd murmur. saw cardiologist until age 34 and it was so small. rechecked 6 years ago and all was fine    Patient Active Problem List   Diagnosis Date Noted  . Asthma without status asthmaticus 06/19/2018  . Cardiac murmur 06/19/2018  . Dyspareunia, female 06/19/2018  . Exertional dyspnea 06/19/2018  . Former tobacco use 06/19/2018  . IBS (irritable bowel syndrome) 06/19/2018  . Knee pain 06/19/2018  . Pelvic pain in female 06/19/2018  . Migraine 06/19/2018  . Endometriosis of pelvis 09/27/2017  . Postoperative state 09/11/2017  . Radiculopathy of lumbar region 08/31/2016  . Anxiety 12/05/2013  . B12 deficiency 12/05/2013  . Depression 12/05/2013  . Neck pain 12/05/2013  . Obesity 12/05/2013    Past Surgical History:  Procedure Laterality Date  . KNEE ARTHROSCOPY Right 2004  . LAPAROSCOPIC VAGINAL HYSTERECTOMY WITH SALPINGECTOMY Bilateral 09/11/2017   Procedure: LAPAROSCOPIC ASSISTED VAGINAL HYSTERECTOMY WITH SALPINGECTOMY;  Surgeon: Schermerhorn, Ihor Austin, MD;  Location: [facility];  Service: Gynecology;  Laterality: Bilateral;  . LAPAROSCOPY N/A 08/03/2018   Procedure: LAPAROSCOPY OPERATIVE,;  Surgeon: Suzy Bouchard, MD;  Location: [facility];  Service: Gynecology;  Laterality: N/A;  . LYSIS OF ADHESION N/A 08/03/2018   Procedure: LYSIS OF ADHESION;  Surgeon:  Feliberto Gottron, Ihor Austin, MD;  Location: [facility];  Service: Gynecology;  Laterality: N/A;  . TONSILLECTOMY  age 77    OB History   No obstetric history on file.      Home Medications    Prior to Admission medications   Medication Sig Start Date End Date Taking? Authorizing Provider  Aspirin-Acetaminophen-Caffeine (EXCEDRIN MIGRAINE PO) Take 1-2 tablets by mouth as needed.   Yes [provider]  benzonatate (TESSALON) 100 MG capsule Take 1 capsule (100 mg total) by mouth 3 (three) times daily as needed for cough. 10/04/18   Waldon Merl, PA-C  ondansetron (ZOFRAN) 4 MG tablet Take 1 tablet (4 mg total) by mouth every 8 (eight) hours as needed for nausea or vomiting. 10/04/18   Waldon Merl, PA-C  pseudoephedrine (SUDAFED) 30 MG tablet Take 30 mg by mouth every 4 (four) hours as needed for congestion.    [provider]    Family History Family History  Problem Relation Age of Onset  . Cardiomyopathy Father   . Migraines Mother   . Breast cancer Maternal Grandmother 21    Social History Social History   Tobacco Use  . Smoking status: Former Smoker    Quit date: 07/27/2014    Years since quitting: 5.6  . Smokeless tobacco: Never Used  Vaping Use  . Vaping Use: Never used  Substance Use Topics  . Alcohol use: Yes    Alcohol/week: 0.0 standard drinks    Comment: rarely  . Drug use:  No     Allergies   Azithromycin, Flonase [fluticasone propionate], Flonase [fluticasone], and Topamax [topiramate]   Review of Systems Review of Systems  Constitutional: Negative for chills and fever.  HENT: Positive for ear pain and sore throat.   Eyes: Negative for pain and visual disturbance.  Respiratory: Positive for cough. Negative for shortness of breath.   Cardiovascular: Negative for chest pain and palpitations.  Gastrointestinal: Negative for abdominal pain, diarrhea and vomiting.  Genitourinary: Negative for dysuria and hematuria.  Musculoskeletal:  Negative for arthralgias and back pain.  Skin: Negative for color change and rash.  Neurological: Negative for seizures and syncope.  All other systems reviewed and are negative.    Physical Exam Triage Vital Signs ED Triage Vitals  Enc Vitals Group     BP 03/22/20 1124 (!) 134/100     Pulse Rate 03/22/20 1124 79     Resp 03/22/20 1124 18     Temp 03/22/20 1124 98.1 F (36.7 C)     Temp Source 03/22/20 1124 Oral     SpO2 03/22/20 1124 100 %     Weight 03/22/20 1122 225 lb 1.4 oz (102.1 kg)     Height 03/22/20 1122 5\' 2"  (1.575 m)     Head Circumference --      Peak Flow --      Pain Score 03/22/20 1122 4     Pain Loc --      Pain Edu? --      Excl. in GC? --    No data found.  Updated Vital Signs BP (!) 134/100 (BP Location: Right Arm)   Pulse 79   Temp 98.1 F (36.7 C) (Oral)   Resp 18   Ht 5\' 2"  (1.575 m)   Wt 225 lb 1.4 oz (102.1 kg)   LMP 01/16/2017   SpO2 100%   BMI 41.17 kg/m   Visual Acuity Right Eye Distance:   Left Eye Distance:   Bilateral Distance:    Right Eye Near:   Left Eye Near:    Bilateral Near:     Physical Exam Vitals and nursing note reviewed.  Constitutional:      General: She is not in acute distress.    Appearance: She is well-developed. She is not ill-appearing.  HENT:     Head: Normocephalic and atraumatic.     Right Ear: Ear canal normal. A middle ear effusion is present.     Left Ear: Ear canal normal. A middle ear effusion is present.     Nose: Nose normal.     Mouth/Throat:     Mouth: Mucous membranes are moist.     Pharynx: Oropharynx is clear.  Eyes:     Conjunctiva/sclera: Conjunctivae normal.  Cardiovascular:     Rate and Rhythm: Normal rate and regular rhythm.     Heart sounds: No murmur heard.   Pulmonary:     Effort: Pulmonary effort is normal. No respiratory distress.     Breath sounds: Normal breath sounds.  Abdominal:     Palpations: Abdomen is soft.     Tenderness: There is no abdominal tenderness.  There is no guarding or rebound.  Musculoskeletal:     Cervical back: Neck supple.  Skin:    General: Skin is warm and dry.     Findings: No rash.  Neurological:     General: No focal deficit present.     Mental Status: She is alert and oriented to person, place, and time.  Gait: Gait normal.  Psychiatric:        Mood and Affect: Mood normal.        Behavior: Behavior normal.      UC Treatments / Results  Labs (all labs ordered are listed, but only abnormal results are displayed) Labs Reviewed  GROUP A STREP BY PCR  SARS CORONAVIRUS 2 (TAT 6-24 HRS)    EKG   Radiology No results found.  Procedures Procedures (including critical care time)  Medications Ordered in UC Medications - No data to display  Initial Impression / Assessment and Plan / UC Course  I have reviewed the triage vital signs and the nursing notes.  Pertinent labs & imaging results that were available during my care of the patient were reviewed by me and considered in my medical decision making (see chart for details).   Viral URI with cough.  Elevated blood pressure reading.  PCR strep negative.  PCR COVID pending.  Instructed patient to self quarantine until the test result is back.  Discussed symptomatic treatment as needed.  Instructed her to go to the ED if she has acute worsening symptoms.  Discussed that her blood pressure is elevated today and needs to be rechecked by her PCP in 2 to 4 weeks.  Patient agrees to plan of care.  Final Clinical Impressions(s) / UC Diagnoses   Final diagnoses:  Viral URI with cough  Elevated blood pressure reading     Discharge Instructions     Your strep test is negative.    Your COVID test is pending.  You should self quarantine until the test result is back.    Take Tylenol as needed for fever or discomfort.  Rest and keep yourself hydrated.    Go to the emergency department if you develop acute worsening symptoms.    Your blood pressure is elevated  today at 134/100.  Please have this rechecked by your primary care provider in 2-4 weeks.         ED Prescriptions    None     PDMP not reviewed this encounter.   Mickie Bail, NP 03/22/20 1204

## 2020-03-22 NOTE — Discharge Instructions (Addendum)
Your strep test is negative.    Your COVID test is pending.  You should self quarantine until the test result is back.    Take Tylenol as needed for fever or discomfort.  Rest and keep yourself hydrated.    Go to the emergency department if you develop acute worsening symptoms.    Your blood pressure is elevated today at 134/100.  Please have this rechecked by your primary care provider in 2-4 weeks.

## 2020-03-23 LAB — SARS CORONAVIRUS 2 (TAT 6-24 HRS): SARS Coronavirus 2: NEGATIVE

## 2020-06-14 ENCOUNTER — Ambulatory Visit: Admit: 2020-06-14 | Discharge status: Absent without leave | Payer: Self-pay

## 2020-07-26 ENCOUNTER — Ambulatory Visit: Admit: 2020-07-26 | Discharge status: Against Medical Advice | Payer: Self-pay

## 2020-08-11 DIAGNOSIS — G4440 Drug-induced headache, not elsewhere classified, not intractable: Secondary | ICD-10-CM | POA: Insufficient documentation

## 2020-08-11 DIAGNOSIS — G4719 Other hypersomnia: Secondary | ICD-10-CM | POA: Insufficient documentation

## 2020-09-13 IMAGING — CR DG CHEST 2V
1 series · 2 of 2 positions shown · non-contrast
Comparison: None.

CLINICAL DATA: Productive cough

EXAM:
CHEST - 2 VIEW

[Series 1: dg chest 2 view · 0.14mm/px · 2 of 2 slices shown]
[im 1/2]
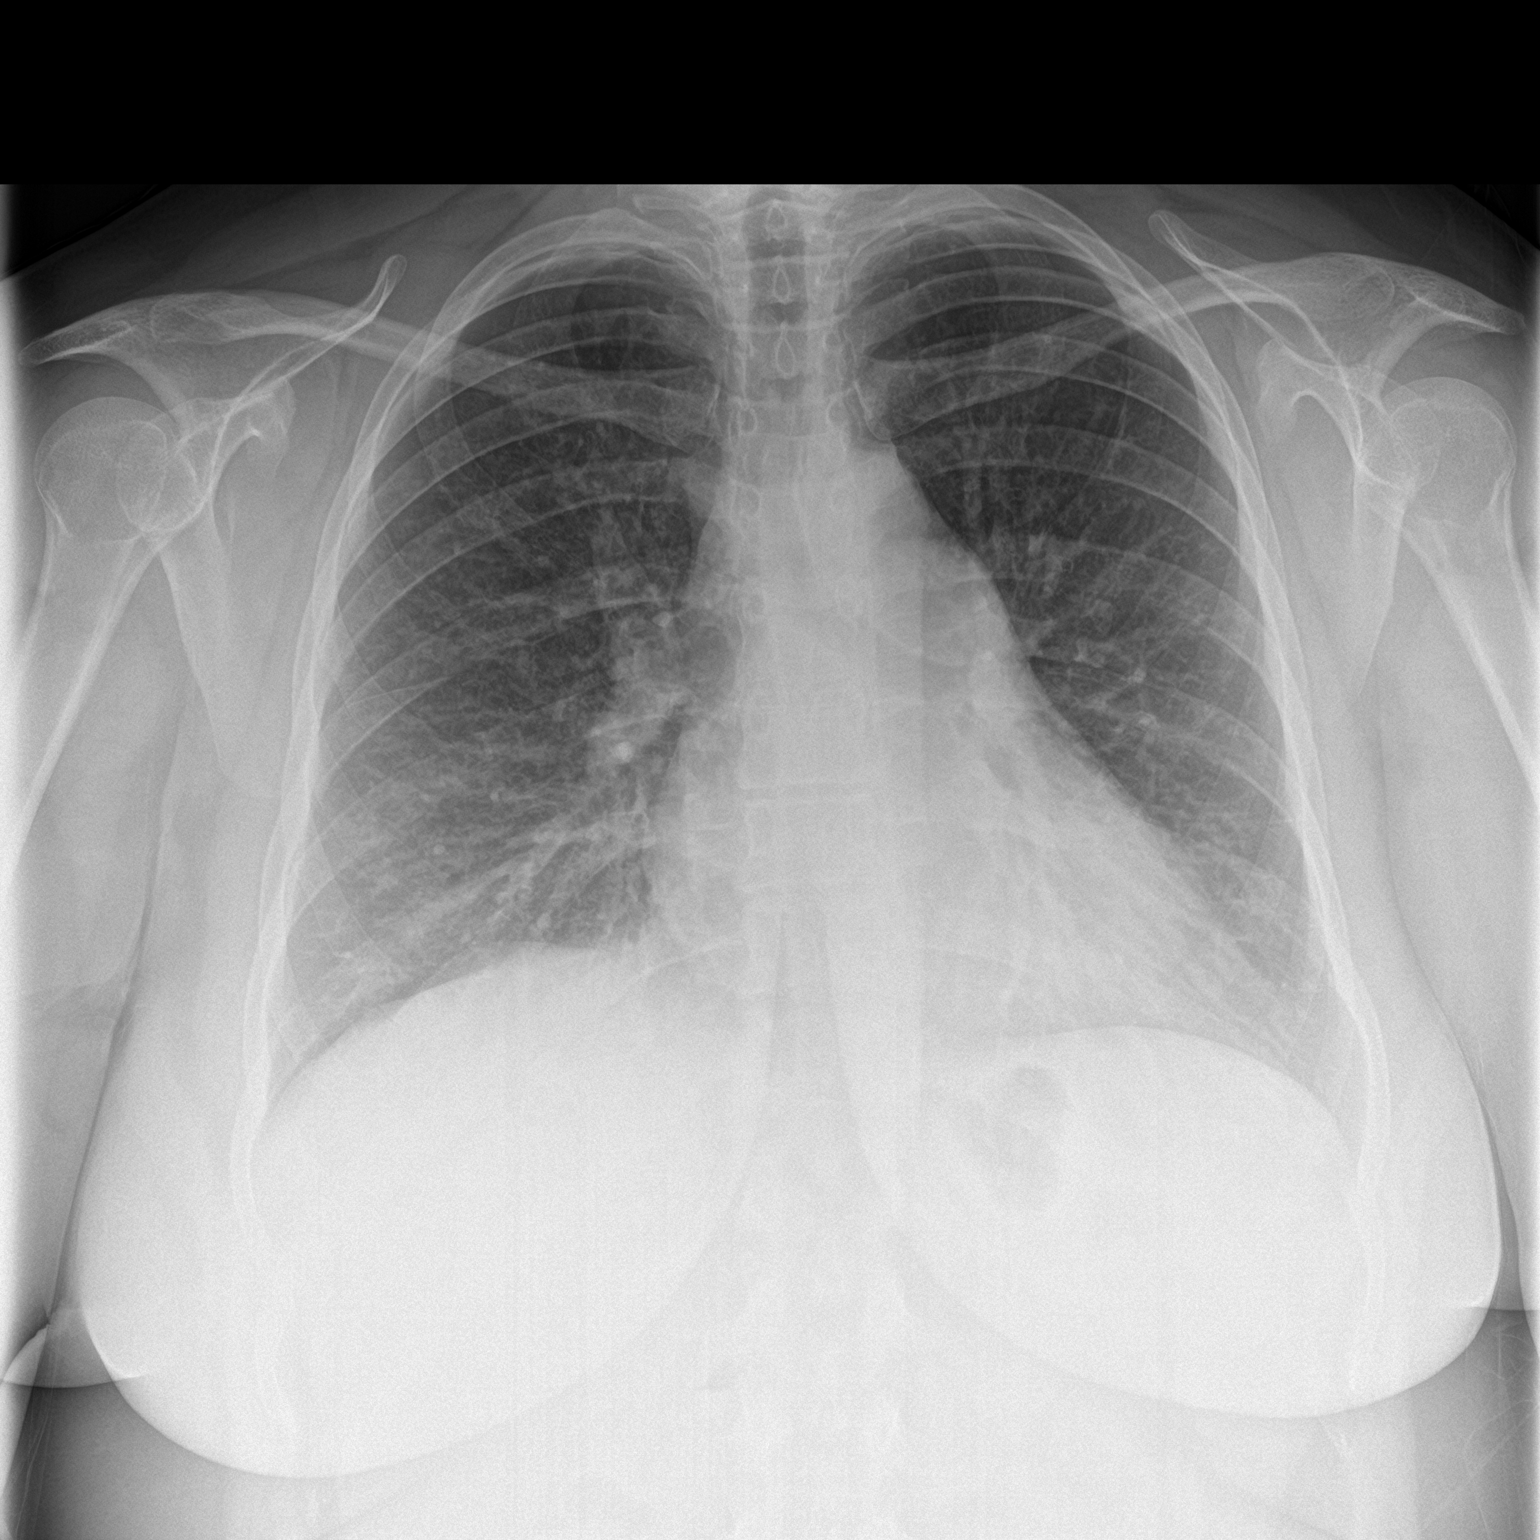
[im 2/2]
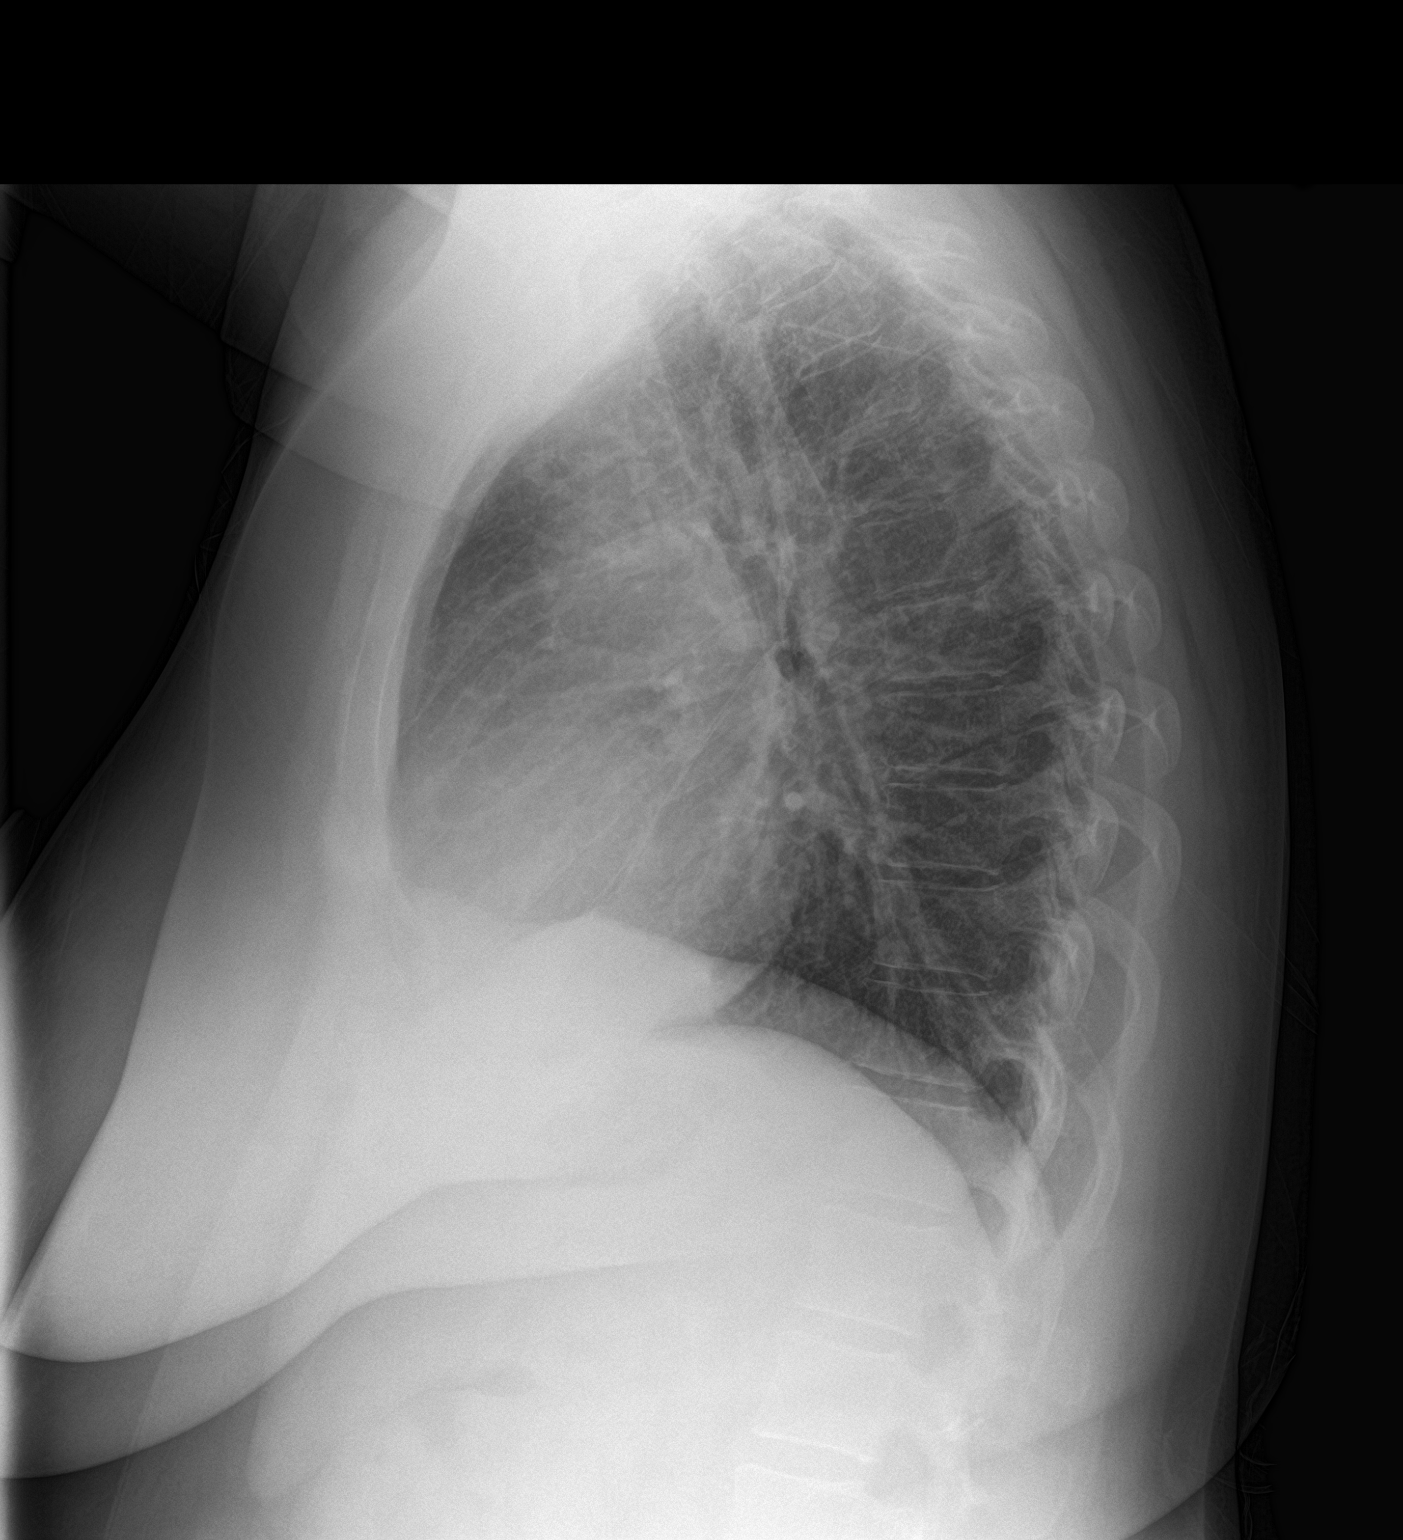

[2 of 2 positions shown; findings below may reference images not displayed]

FINDINGS: Mild peribronchial thickening. Heart and mediastinal contours are
within normal limits. No focal opacities or effusions. No acute bony
abnormality.
IMPRESSION: Mild bronchitic changes.

## 2020-09-14 ENCOUNTER — Other Ambulatory Visit: Payer: Self-pay

## 2020-09-14 ENCOUNTER — Ambulatory Visit
Admission: EM | Admit: 2020-09-14 | Discharge: 2020-09-14 | Disposition: A | Discharge status: Home / Self Care | Payer: Medicaid Other | Attending: Internal Medicine | Admitting: Internal Medicine

## 2020-09-14 ENCOUNTER — Ambulatory Visit (INDEPENDENT_AMBULATORY_CARE_PROVIDER_SITE_OTHER): Payer: Medicaid Other

## 2020-09-14 DIAGNOSIS — S93512A Sprain of interphalangeal joint of left great toe, initial encounter: Secondary | ICD-10-CM

## 2020-09-14 DIAGNOSIS — S93511A Sprain of interphalangeal joint of right great toe, initial encounter: Secondary | ICD-10-CM

## 2020-09-14 MED ORDER — IBUPROFEN 600 MG PO TABS: 600.0000 mg | ORAL_TABLET | Freq: Four times a day (QID) | ORAL | 0 refills | Status: DC | PRN

## 2020-09-14 NOTE — Discharge Instructions (Signed)
Take medications as tolerated Gentle range of motion exercises Icing of the left foot as needed If symptoms worsen please return to urgent care to be reevaluated.

## 2020-09-14 NOTE — ED Triage Notes (Signed)
Pt c/o left foot pain, mostly around the ball joint, since this weekend. Pt states the pain increased with movement. Pt denies any known injury, swelling or bruising. Pt has not taken any OTC meds for the pain.

## 2020-09-17 NOTE — ED Provider Notes (Signed)
[facility]    CSN: [id number] Arrival date & time: 09/14/20  0825      History   Chief Complaint Chief Complaint  Patient presents with  . Foot Pain    left    HPI [name] [name] is a 35 y.o. female comes to the urgent care with complaints of left great toe pain which started few days ago.  Patient denies any trauma to the left foot.  No history of pain in the left great toe..  Pain is of moderate severity, aggravated by bearing weight and movement.  No known relieving factors.  Not associated with any bruising, swelling or erythema.  No fever or chills.  Patient denies any numbness or tingling.  No history of gout.   HPI  Past Medical History:  Diagnosis Date  . Abdominal cramping   . Asthma    as a child  . Headache    migraines  . Heart murmur    asd murmur. saw cardiologist until age 18 and it was so small. rechecked 6 years ago and all was fine    Patient Active Problem List   Diagnosis Date Noted  . Asthma without status asthmaticus 06/19/2018  . Cardiac murmur 06/19/2018  . Dyspareunia, female 06/19/2018  . Exertional dyspnea 06/19/2018  . Former tobacco use 06/19/2018  . IBS (irritable bowel syndrome) 06/19/2018  . Knee pain 06/19/2018  . Pelvic pain in female 06/19/2018  . Migraine 06/19/2018  . Endometriosis of pelvis 09/27/2017  . Postoperative state 09/11/2017  . Radiculopathy of lumbar region 08/31/2016  . Anxiety 12/05/2013  . B12 deficiency 12/05/2013  . Depression 12/05/2013  . Neck pain 12/05/2013  . Obesity 12/05/2013    Past Surgical History:  Procedure Laterality Date  . KNEE ARTHROSCOPY Right 2004  . LAPAROSCOPIC VAGINAL HYSTERECTOMY WITH SALPINGECTOMY Bilateral 09/11/2017   Procedure: LAPAROSCOPIC ASSISTED VAGINAL HYSTERECTOMY WITH SALPINGECTOMY;  Surgeon: Schermerhorn, Ihor Austin, MD;  Location: [facility];  Service: Gynecology;  Laterality: Bilateral;  . LAPAROSCOPY N/A 08/03/2018   Procedure: LAPAROSCOPY OPERATIVE,;   Surgeon: Suzy Bouchard, MD;  Location: [facility];  Service: Gynecology;  Laterality: N/A;  . LYSIS OF ADHESION N/A 08/03/2018   Procedure: LYSIS OF ADHESION;  Surgeon: Feliberto Gottron, Ihor Austin, MD;  Location: [facility];  Service: Gynecology;  Laterality: N/A;  . TONSILLECTOMY  age 37    OB History   No obstetric history on file.      Home Medications    Prior to Admission medications   Medication Sig Start Date End Date Taking? Authorizing Provider  albuterol (VENTOLIN HFA) 108 (90 Base) MCG/ACT inhaler Inhale 2 puffs into the lungs every 6 (six) hours as needed.   Yes [provider]  ibuprofen (ADVIL) 600 MG tablet Take 1 tablet (600 mg total) by mouth every 6 (six) hours as needed. 09/14/20  Yes Allaya Abbasi, Britta Mccreedy, MD    Family History Family History  Problem Relation Age of Onset  . Cardiomyopathy Father   . Migraines Mother   . Breast cancer Maternal Grandmother 51    Social History Social History   Tobacco Use  . Smoking status: Former Smoker    Quit date: 07/27/2014    Years since quitting: 6.1  . Smokeless tobacco: Never Used  Vaping Use  . Vaping Use: Never used  Substance Use Topics  . Alcohol use: Yes    Alcohol/week: 0.0 standard drinks    Comment: rarely  . Drug use: No     Allergies  Azithromycin, Flonase [fluticasone propionate], Flonase [fluticasone], and Topamax [topiramate]   Review of Systems Review of Systems  Gastrointestinal: Negative.   Genitourinary: Negative.   Musculoskeletal: Positive for arthralgias.  Skin: Negative.   Neurological: Negative.      Physical Exam Triage Vital Signs ED Triage Vitals  Enc Vitals Group     BP 09/14/20 0844 (!) 137/92     Pulse Rate 09/14/20 0844 67     Resp 09/14/20 0844 18     Temp 09/14/20 0844 98.2 F (36.8 C)     Temp Source 09/14/20 0844 Oral     SpO2 09/14/20 0844 100 %     Weight 09/14/20 0842 220 lb (99.8 kg)     Height 09/14/20 0842 5\' 2"  (1.575 m)     Head  Circumference --      Peak Flow --      Pain Score 09/14/20 0841 8     Pain Loc --      Pain Edu? --      Excl. in GC? --    No data found.  Updated Vital Signs BP (!) 137/92 (BP Location: Left Arm)   Pulse 67   Temp 98.2 F (36.8 C) (Oral)   Resp 18   Ht 5\' 2"  (1.575 m)   Wt 99.8 kg   LMP 01/16/2017   SpO2 100%   BMI 40.24 kg/m   Visual Acuity Right Eye Distance:   Left Eye Distance:   Bilateral Distance:    Right Eye Near:   Left Eye Near:    Bilateral Near:     Physical Exam Vitals and nursing note reviewed.  Constitutional:      General: She is not in acute distress.    Appearance: She is not ill-appearing.  Cardiovascular:     Rate and Rhythm: Normal rate and regular rhythm.  Pulmonary:     Effort: Pulmonary effort is normal.     Breath sounds: Normal breath sounds.  Abdominal:     General: Bowel sounds are normal.     Palpations: Abdomen is soft.  Musculoskeletal:        General: Tenderness present. No signs of injury. Normal range of motion.     Right lower leg: No edema.     Left lower leg: No edema.  Skin:    General: Skin is warm.  Neurological:     Mental Status: She is alert.      UC Treatments / Results  Labs (all labs ordered are listed, but only abnormal results are displayed) Labs Reviewed - No data to display  EKG   Radiology No results found.  Procedures Procedures (including critical care time)  Medications Ordered in UC Medications - No data to display  Initial Impression / Assessment and Plan / UC Course  I have reviewed the triage vital signs and the nursing notes.  Pertinent labs & imaging results that were available during my care of the patient were reviewed by me and considered in my medical decision making (see chart for details).     1.  Left great toe pain: X-ray of the left great toe is negative for any fracture Ibuprofen 600 mg every 6 hours as needed for pain Icing of the left foot is helpful Gentle  range of motion exercises If symptoms worsen please return to urgent care to be reevaluated   Final Clinical Impressions(s) / UC Diagnoses   Final diagnoses:  Sprain of interphalangeal joint of left great toe, initial encounter  Discharge Instructions     Take medications as tolerated Gentle range of motion exercises Icing of the left foot as needed If symptoms worsen please return to urgent care to be reevaluated.    ED Prescriptions    Medication Sig Dispense Auth. Provider   ibuprofen (ADVIL) 600 MG tablet Take 1 tablet (600 mg total) by mouth every 6 (six) hours as needed. 30 tablet Colbert Curenton, Britta Mccreedy, MD     PDMP not reviewed this encounter.   Merrilee Jansky, MD 09/17/20 [phone]

## 2020-10-05 ENCOUNTER — Ambulatory Visit
Admission: EM | Admit: 2020-10-05 | Discharge: 2020-10-05 | Disposition: A | Discharge status: Home / Self Care | Payer: Medicaid Other | Attending: Physician Assistant | Admitting: Physician Assistant

## 2020-10-05 ENCOUNTER — Ambulatory Visit (INDEPENDENT_AMBULATORY_CARE_PROVIDER_SITE_OTHER): Payer: Medicaid Other

## 2020-10-05 ENCOUNTER — Other Ambulatory Visit: Payer: Self-pay

## 2020-10-05 ENCOUNTER — Encounter: Payer: Self-pay | Admitting: Emergency Medicine

## 2020-10-05 DIAGNOSIS — M545 Low back pain, unspecified: Secondary | ICD-10-CM | POA: Diagnosis not present

## 2020-10-05 DIAGNOSIS — Z20822 Contact with and (suspected) exposure to covid-19: Secondary | ICD-10-CM | POA: Diagnosis not present

## 2020-10-05 DIAGNOSIS — B349 Viral infection, unspecified: Secondary | ICD-10-CM

## 2020-10-05 DIAGNOSIS — R051 Acute cough: Secondary | ICD-10-CM

## 2020-10-05 DIAGNOSIS — R0602 Shortness of breath: Secondary | ICD-10-CM | POA: Diagnosis present

## 2020-10-05 DIAGNOSIS — R059 Cough, unspecified: Secondary | ICD-10-CM

## 2020-10-05 LAB — URINALYSIS, COMPLETE (UACMP) WITH MICROSCOPIC
Bacteria, UA: NONE SEEN
Bilirubin Urine: NEGATIVE
Glucose, UA: NEGATIVE mg/dL
Ketones, ur: NEGATIVE mg/dL
Leukocytes,Ua: NEGATIVE
Nitrite: NEGATIVE
Protein, ur: NEGATIVE mg/dL
Specific Gravity, Urine: 1.02 (ref 1.005–1.030)
WBC, UA: NONE SEEN WBC/hpf (ref 0–5)
pH: 7 (ref 5.0–8.0)

## 2020-10-05 MED ORDER — ALBUTEROL SULFATE HFA 108 (90 BASE) MCG/ACT IN AERS: 1.0000 | INHALATION_SPRAY | Freq: Four times a day (QID) | RESPIRATORY_TRACT | 0 refills | Status: DC | PRN

## 2020-10-05 MED ORDER — BENZONATATE 100 MG PO CAPS: 200.0000 mg | ORAL_CAPSULE | Freq: Three times a day (TID) | ORAL | 0 refills | Status: AC | PRN

## 2020-10-05 MED ORDER — PREDNISONE 20 MG PO TABS: 40.0000 mg | ORAL_TABLET | Freq: Every day | ORAL | 0 refills | Status: AC

## 2020-10-05 NOTE — ED Triage Notes (Signed)
Patient c/o cough x 1 week. She also reports ear pain, generalized body aches. She reports low back pain that started yesterday and states she has a history of uti's.

## 2020-10-05 NOTE — Discharge Instructions (Signed)
Your chest x-ray is normal.  You may have a chest cold.  This can take a couple weeks to get over sometimes.  I sent a cough medication for you as well as prednisone and an inhaler for you to use since you are feeling short of breath.  The Covid test result be back tomorrow.  We will call you if it is positive.  You may be referred to the [facility] clinic if your test is positive.  If that is negative then this is still likely a viral chest cold and will need to run its course.  Make sure you are resting and increasing fluid intake.  If you develop fever, worsening cough, increased chest pain or breathing difficulty need to be seen again.  For any severe acute worsening symptoms, go to ED.

## 2020-10-05 NOTE — ED Provider Notes (Signed)
[facility]    CSN: [id number] Arrival date & time: 10/05/20  1207      History   Chief Complaint Chief Complaint  Patient presents with  . Cough  . Back Pain  . Ear Pain    HPI [name] [name] is a 35 y.o. female presenting for approximately 1 week history of cough that has been productive of yellowish sputum.  She also has bilateral ear pain and pressure as well as generalized body aches with lower back pain primarily.  Admits to fatigue.  Denies any fevers.  She does have some nasal congestion that is very mild.  Denies any headaches, sore throat, nausea/vomiting or diarrhea.  She has felt some chest tightness and difficulty breathing.  Says that her daughter is sick with viral pneumonia.  She says her husband also has a cough.  Patient does not taking any medication for symptoms.  She denies any known COVID-19 exposure.  She says her daughter was not tested for COVID-19.  Patient is otherwise healthy without any significant medical problems.  No other concerns.  HPI  Past Medical History:  Diagnosis Date  . Abdominal cramping   . Asthma    as a child  . Headache    migraines  . Heart murmur    asd murmur. saw cardiologist until age 35 and it was so small. rechecked 6 years ago and all was fine    Patient Active Problem List   Diagnosis Date Noted  . Asthma without status asthmaticus 06/19/2018  . Cardiac murmur 06/19/2018  . Dyspareunia, female 06/19/2018  . Exertional dyspnea 06/19/2018  . Former tobacco use 06/19/2018  . IBS (irritable bowel syndrome) 06/19/2018  . Knee pain 06/19/2018  . Pelvic pain in female 06/19/2018  . Migraine 06/19/2018  . Endometriosis of pelvis 09/27/2017  . Postoperative state 09/11/2017  . Radiculopathy of lumbar region 08/31/2016  . Anxiety 12/05/2013  . B12 deficiency 12/05/2013  . Depression 12/05/2013  . Neck pain 12/05/2013  . Obesity 12/05/2013    Past Surgical History:  Procedure Laterality Date  .  KNEE ARTHROSCOPY Right 2004  . LAPAROSCOPIC VAGINAL HYSTERECTOMY WITH SALPINGECTOMY Bilateral 09/11/2017   Procedure: LAPAROSCOPIC ASSISTED VAGINAL HYSTERECTOMY WITH SALPINGECTOMY;  Surgeon: Schermerhorn, Ihor Austinhomas J, MD;  Location: [facility];  Service: Gynecology;  Laterality: Bilateral;  . LAPAROSCOPY N/A 08/03/2018   Procedure: LAPAROSCOPY OPERATIVE,;  Surgeon: Suzy BouchardSchermerhorn, Thomas J, MD;  Location: [facility];  Service: Gynecology;  Laterality: N/A;  . LYSIS OF ADHESION N/A 08/03/2018   Procedure: LYSIS OF ADHESION;  Surgeon: Feliberto GottronSchermerhorn, Ihor Austinhomas J, MD;  Location: [facility];  Service: Gynecology;  Laterality: N/A;  . TONSILLECTOMY  age 35    OB History   No obstetric history on file.      Home Medications    Prior to Admission medications   Medication Sig Start Date End Date Taking? Authorizing Provider  albuterol (VENTOLIN HFA) 108 (90 Base) MCG/ACT inhaler Inhale 1-2 puffs into the lungs every 6 (six) hours as needed for up to 10 days for wheezing or shortness of breath. 10/05/20 10/15/20 Yes Shirlee LatchEaves, Lashane Whelpley B, PA-C  benzonatate (TESSALON) 100 MG capsule Take 2 capsules (200 mg total) by mouth 3 (three) times daily as needed for up to 7 days for cough. 10/05/20 10/12/20 Yes Shirlee LatchEaves, Alice Vitelli B, PA-C  loratadine (CLARITIN) 10 MG tablet Take 10 mg by mouth daily.   Yes [provider]  predniSONE (DELTASONE) 20 MG tablet Take 2 tablets (40 mg total) by mouth  daily for 5 days. 10/05/20 10/10/20 Yes Shirlee Latch, PA-C    Family History Family History  Problem Relation Age of Onset  . Cardiomyopathy Father   . Migraines Mother   . Breast cancer Maternal Grandmother 11    Social History Social History   Tobacco Use  . Smoking status: Former Smoker    Quit date: 07/27/2014    Years since quitting: 6.1  . Smokeless tobacco: Never Used  Vaping Use  . Vaping Use: Never used  Substance Use Topics  . Alcohol use: Yes    Alcohol/week: 0.0 standard drinks    Comment: rarely  . Drug use: No      Allergies   Azithromycin, Flonase [fluticasone propionate], Flonase [fluticasone], and Topamax [topiramate]   Review of Systems Review of Systems  Constitutional: Positive for fatigue. Negative for chills, diaphoresis and fever.  HENT: Positive for congestion and ear pain. Negative for rhinorrhea, sinus pressure, sinus pain and sore throat.   Respiratory: Positive for cough, chest tightness and shortness of breath.   Gastrointestinal: Negative for abdominal pain, nausea and vomiting.  Musculoskeletal: Positive for back pain and myalgias. Negative for arthralgias.  Skin: Negative for rash.  Neurological: Negative for weakness and headaches.  Hematological: Negative for adenopathy.     Physical Exam Triage Vital Signs ED Triage Vitals  Enc Vitals Group     BP 10/05/20 1307 126/90     Pulse Rate 10/05/20 1307 72     Resp 10/05/20 1307 18     Temp 10/05/20 1307 98.5 F (36.9 C)     Temp Source 10/05/20 1307 Oral     SpO2 10/05/20 1307 99 %     Weight 10/05/20 1306 220 lb (99.8 kg)     Height 10/05/20 1306 5\' 2"  (1.575 m)     Head Circumference --      Peak Flow --      Pain Score 10/05/20 1306 5     Pain Loc --      Pain Edu? --      Excl. in GC? --    No data found.  Updated Vital Signs BP 126/90 (BP Location: Right Arm)   Pulse 72   Temp 98.5 F (36.9 C) (Oral)   Resp 18   Ht 5\' 2"  (1.575 m)   Wt 220 lb (99.8 kg)   LMP 01/16/2017   SpO2 99%   BMI 40.24 kg/m       Physical Exam Vitals and nursing note reviewed.  Constitutional:      General: She is not in acute distress.    Appearance: Normal appearance. She is not ill-appearing or toxic-appearing.  HENT:     Head: Normocephalic and atraumatic.     Right Ear: Tympanic membrane, ear canal and external ear normal.     Left Ear: Tympanic membrane, ear canal and external ear normal.     Nose: Congestion present.     Mouth/Throat:     Mouth: Mucous membranes are moist.     Pharynx: Oropharynx is  clear.  Eyes:     General: No scleral icterus.       Right eye: No discharge.        Left eye: No discharge.     Conjunctiva/sclera: Conjunctivae normal.  Cardiovascular:     Rate and Rhythm: Normal rate and regular rhythm.     Heart sounds: Normal heart sounds.  Pulmonary:     Effort: Pulmonary effort is normal. No respiratory distress.  Breath sounds: Normal breath sounds. No wheezing, rhonchi or rales.  Musculoskeletal:     Cervical back: Neck supple.  Skin:    General: Skin is dry.  Neurological:     General: No focal deficit present.     Mental Status: She is alert. Mental status is at baseline.     Motor: No weakness.     Gait: Gait normal.  Psychiatric:        Mood and Affect: Mood normal.        Behavior: Behavior normal.        Thought Content: Thought content normal.      UC Treatments / Results  Labs (all labs ordered are listed, but only abnormal results are displayed) Labs Reviewed  URINALYSIS, COMPLETE (UACMP) WITH MICROSCOPIC - Abnormal; Notable for the following components:      Result Value   Hgb urine dipstick TRACE (*)    All other components within normal limits  SARS CORONAVIRUS 2 (TAT 6-24 HRS)    EKG   Radiology DG Chest 2 View  Result Date: 10/05/2020 CLINICAL DATA:  Cough, shortness of breath EXAM: CHEST - 2 VIEW COMPARISON:  Prior chest x-ray 06/19/2018 FINDINGS: The lungs are clear and negative for focal airspace consolidation, pulmonary edema or suspicious pulmonary nodule. Mild prominence of the contour of the main pulmonary artery without significant change compared to prior imaging. No pleural effusion or pneumothorax. Cardiac and mediastinal contours are within normal limits. No acute fracture or lytic or blastic osseous lesions. The visualized upper abdominal bowel gas pattern is unremarkable. IMPRESSION: No acute cardiopulmonary process. Unchanged appearance of the chest compared to 06/19/2018. Electronically Signed   By: Malachy Moan M.D.   On: 10/05/2020 13:38    Procedures Procedures (including critical care time)  Medications Ordered in UC Medications - No data to display  Initial Impression / Assessment and Plan / UC Course  I have reviewed the triage vital signs and the nursing notes.  Pertinent labs & imaging results that were available during my care of the patient were reviewed by me and considered in my medical decision making (see chart for details).    35 year old female presenting for cough, congestion, chest tightness and breathing difficulty for a week.  Also has generalized body aches and lower back pain.  Denies any urinary complaints but concerned about possible UTI given a history of UTIs.  Vital signs are normal and stable.  Exam only significant for very minor congestion.  Her chest is clear to auscultation heart regular rate and rhythm.  She is in no acute distress and is breathing normally and speaking in full sentences.  Patient ambulated to the imaging department without any difficulty.  Urinalysis significant for trace blood.  No signs of UTI.  Reviewed result patient.  Chest x-ray obtained today does not show any acute abnormality.  Covid test obtained.  Current CDC guidance, isolation protocol and ED precautions reviewed patient.  Advised patient this is likely a viral illness and encouraged supportive care with increasing rest and fluids.  Sent benzonatate for cough and ProAir for the shortness of breath.  She has history of asthma as a child and has occasional use albuterol inhaler.  Also sent prednisone.  Patient has a fluticasone allergy, but reports she can take prednisone fine.  Advised her to follow-up with PCP if not getting better in the next week.  ED and return precautions reviewed patient.  Final Clinical Impressions(s) / UC Diagnoses   Final diagnoses:  Viral illness  Cough  Shortness of breath  Acute bilateral low back pain without sciatica     Discharge  Instructions     Your chest x-ray is normal.  You may have a chest cold.  This can take a couple weeks to get over sometimes.  I sent a cough medication for you as well as prednisone and an inhaler for you to use since you are feeling short of breath.  The Covid test result be back tomorrow.  We will call you if it is positive.  You may be referred to the [facility] clinic if your test is positive.  If that is negative then this is still likely a viral chest cold and will need to run its course.  Make sure you are resting and increasing fluid intake.  If you develop fever, worsening cough, increased chest pain or breathing difficulty need to be seen again.  For any severe acute worsening symptoms, go to ED.    ED Prescriptions    Medication Sig Dispense Auth. Provider   albuterol (VENTOLIN HFA) 108 (90 Base) MCG/ACT inhaler Inhale 1-2 puffs into the lungs every 6 (six) hours as needed for up to 10 days for wheezing or shortness of breath. 1 g Eusebio Friendly B, PA-C   benzonatate (TESSALON) 100 MG capsule Take 2 capsules (200 mg total) by mouth 3 (three) times daily as needed for up to 7 days for cough. 21 capsule Eusebio Friendly B, PA-C   predniSONE (DELTASONE) 20 MG tablet Take 2 tablets (40 mg total) by mouth daily for 5 days. 10 tablet Gareth Morgan     PDMP not reviewed this encounter.   Shirlee Latch, PA-C 10/05/20 [phone]

## 2020-10-06 LAB — SARS CORONAVIRUS 2 (TAT 6-24 HRS): SARS Coronavirus 2: NEGATIVE

## 2020-11-04 DIAGNOSIS — K219 Gastro-esophageal reflux disease without esophagitis: Secondary | ICD-10-CM | POA: Insufficient documentation

## 2020-11-09 ENCOUNTER — Ambulatory Visit: Payer: Medicaid Other

## 2020-11-27 NOTE — Progress Notes (Signed)
[facility] [address] [place], [place] [address]  Pulmonary Sleep Medicine   Office Visit Note  Patient Name: [name] DOB: 07/29/1985 MRN [id number]    Chief Complaint: Obstructive Sleep Apnea visit  Brief History:  [name] [name] is seen today inital sleep consult on APAP 18cmH2O for initial setup 09/2020. for follow up after initial setup.  The patient has a 3 month history of sleep apnea. Patient not using PAP nightly with bedtime approx 1030 pm, wakeup 630am.  The patient feels pretty refreshed after sleeping with PAP.  The patient reports  When she wears PAP therapy,  benefitting from PAP use. Reported sleepiness is  Excessive during the day when not using APAP and the Epworth Sleepiness Score is 14 out of 24. The patient does not take naps. The patient complains of the following: no therapy problems, but disruptions are secondary to caregiving for very sick child.  The compliance download shows  compliance with an average use time of 2:06 hours. The AHI is 0.5  The patient does not complain of limb movements disrupting sleep.  ROS  General: (-) fever, (-) chills, (-) night sweat Nose and Sinuses: (-) nasal stuffiness or itchiness, (-) postnasal drip, (-) nosebleeds, (-) sinus trouble. Mouth and Throat: (-) sore throat, (-) hoarseness. Neck: (-) swollen glands, (-) enlarged thyroid, (-) neck pain. Respiratory: - cough, - shortness of breath, - wheezing. Neurologic: - numbness, - tingling. Psychiatric: - anxiety, - depression   Current Medication: Outpatient Encounter Medications as of 11/30/2020  Medication Sig  . albuterol (VENTOLIN HFA) 108 (90 Base) MCG/ACT inhaler Inhale into the lungs.  . famotidine (PEPCID) 20 MG tablet Take 1 tablet by mouth 2 (two) times daily.  Marland Kitchen FLUoxetine (PROZAC) 20 MG capsule Take by mouth.  Marland Kitchen ipratropium (ATROVENT) 0.03 % nasal spray Place into the nose.  . albuterol (VENTOLIN HFA) 108 (90 Base) MCG/ACT inhaler Inhale 1-2  puffs into the lungs every 6 (six) hours as needed for up to 10 days for wheezing or shortness of breath.  . ATROVENT HFA 17 MCG/ACT inhaler SMARTSIG:2 Inhalation Via Inhaler 4 Times Daily PRN  . cetirizine (ZYRTEC) 10 MG tablet Take by mouth.  . EPINEPHrine 0.3 mg/0.3 mL IJ SOAJ injection See admin instructions.  Marland Kitchen loratadine (CLARITIN) 10 MG tablet Take 10 mg by mouth daily.  . montelukast (SINGULAIR) 10 MG tablet Take 1 tablet by mouth at bedtime.   No facility-administered encounter medications on file as of 11/30/2020.    Surgical History: Past Surgical History:  Procedure Laterality Date  . KNEE ARTHROSCOPY Right 2004  . LAPAROSCOPIC VAGINAL HYSTERECTOMY WITH SALPINGECTOMY Bilateral 09/11/2017   Procedure: LAPAROSCOPIC ASSISTED VAGINAL HYSTERECTOMY WITH SALPINGECTOMY;  Surgeon: Schermerhorn, Ihor Austin, MD;  Location: [facility];  Service: Gynecology;  Laterality: Bilateral;  . LAPAROSCOPY N/A 08/03/2018   Procedure: LAPAROSCOPY OPERATIVE,;  Surgeon: Suzy Bouchard, MD;  Location: [facility];  Service: Gynecology;  Laterality: N/A;  . LYSIS OF ADHESION N/A 08/03/2018   Procedure: LYSIS OF ADHESION;  Surgeon: Feliberto Gottron, Ihor Austin, MD;  Location: [facility];  Service: Gynecology;  Laterality: N/A;  . TONSILLECTOMY  age 59    Medical History: Past Medical History:  Diagnosis Date  . Abdominal cramping   . Asthma    as a child  . Headache    migraines  . Heart murmur    asd murmur. saw cardiologist until age 72 and it was so small. rechecked 6 years ago and all was fine  Family History: Non contributory to the present illness  Social History: Social History   Socioeconomic History  . Marital status: Single    Spouse name: Not on file  . Number of children: Not on file  . Years of education: Not on file  . Highest education level: Not on file  Occupational History  . Not on file  Tobacco Use  . Smoking status: Former Smoker    Quit date: 07/27/2014    Years since  quitting: 6.3  . Smokeless tobacco: Never Used  Vaping Use  . Vaping Use: Never used  Substance and Sexual Activity  . Alcohol use: Yes    Alcohol/week: 0.0 standard drinks    Comment: rarely  . Drug use: No  . Sexual activity: Yes  Other Topics Concern  . Not on file  Social History Narrative  . Not on file   Social Determinants of Health   Financial Resource Strain: Not on file  Food Insecurity: Not on file  Transportation Needs: Not on file  Physical Activity: Not on file  Stress: Not on file  Social Connections: Not on file  Intimate Partner Violence: Not on file    Vital Signs: Blood pressure 133/74, pulse 86, resp. rate 16, height 5\' 2"  (1.575 m), weight 230 lb (104.3 kg), last menstrual period 01/16/2017, SpO2 96 %.  Examination: General Appearance: The patient is well-developed, well-nourished, and in no distress. Neck Circumference: 36 Skin: Gross inspection of skin unremarkable. Head: normocephalic, no gross deformities. Eyes: no gross deformities noted. ENT: ears appear grossly normal Neurologic: Alert and oriented. No involuntary movements.    EPWORTH SLEEPINESS SCALE:  Scale:  (0)= no chance of dozing; (1)= slight chance of dozing; (2)= moderate chance of dozing; (3)= high chance of dozing  Chance  Situtation    Sitting and reading: 3    Watching TV: 3    Sitting Inactive in public: 1    As a passenger in car: 1      Lying down to rest: 3    Sitting and talking: 0    Sitting quielty after lunch: 3    In a car, stopped in traffic: 0   TOTAL SCORE:   14 out of 24    SLEEP STUDIES:  1. HST 06/18/2020  -  REI 9, supine REI 11,  lateral REI 8,  low SpO2 81%   CPAP COMPLIANCE DATA:  Date Range: 10/25/20 - 11/23/20  Average Daily Use: 2:06 hours  Median Use: 3:24 hourss  Compliance for > 4 Hours: 23% days  AHI: 0.5 respiratory events per hour  Days Used: 15/30 days  Mask Leak: 8.8 lpm  95th Percentile Pressure: 9.0   cmH2O         LABS: Recent Results (from the past 2160 hour(s))  Urinalysis, Complete w Microscopic Urine, Clean Catch     Status: Abnormal   Collection Time: 10/05/20  1:11 PM  Result Value Ref Range   Color, Urine YELLOW YELLOW   APPearance CLEAR CLEAR   Specific Gravity, Urine 1.020 1.005 - 1.030   pH 7.0 5.0 - 8.0   Glucose, UA NEGATIVE NEGATIVE mg/dL   Hgb urine dipstick TRACE (A) NEGATIVE   Bilirubin Urine NEGATIVE NEGATIVE   Ketones, ur NEGATIVE NEGATIVE mg/dL   Protein, ur NEGATIVE NEGATIVE mg/dL   Nitrite NEGATIVE NEGATIVE   Leukocytes,Ua NEGATIVE NEGATIVE   Squamous Epithelial / LPF 6-10 0 - 5   WBC, UA NONE SEEN 0 - 5 WBC/hpf   RBC /  HPF 0-5 0 - 5 RBC/hpf   Bacteria, UA NONE SEEN NONE SEEN    Comment: Performed at [facility], [address] [place], [place] [address]  SARS CORONAVIRUS 2 (TAT 6-24 HRS) Nasopharyngeal Nasopharyngeal Swab     Status: None   Collection Time: 10/05/20  1:11 PM   Specimen: Nasopharyngeal Swab  Result Value Ref Range   SARS Coronavirus 2 NEGATIVE NEGATIVE    Comment: (NOTE) SARS-CoV-2 target nucleic acids are NOT DETECTED.  The SARS-CoV-2 RNA is generally detectable in upper and lower respiratory specimens during the acute phase of infection. Negative results do not preclude SARS-CoV-2 infection, do not rule out co-infections with other pathogens, and should not be used as the sole basis for treatment or other patient management decisions. Negative results must be combined with clinical observations, patient history, and epidemiological information. The expected result is Negative.  Fact Sheet for Patients: HairSlick.nohttps://www.fda.gov/media/138098/download  Fact Sheet for Healthcare Providers: quierodirigir.comhttps://www.fda.gov/media/138095/download  This test is not yet approved or cleared by the [place] FDA and  has been authorized for detection and/or diagnosis of SARS-CoV-2 by FDA under an Emergency Use Authorization  (EUA). This EUA will remain  in effect (meaning this test can be used) for the duration of the COVID-19 declaration under Se ction 564(b)(1) of the Act, 21 U.S.C. section 360bbb-3(b)(1), unless the authorization is terminated or revoked sooner.  Performed at [facility], [address] [place], [place] [address]     Radiology: DG Chest 2 View  Result Date: 10/05/2020 CLINICAL DATA:  Cough, shortness of breath EXAM: CHEST - 2 VIEW COMPARISON:  Prior chest x-ray 06/19/2018 FINDINGS: The lungs are clear and negative for focal airspace consolidation, pulmonary edema or suspicious pulmonary nodule. Mild prominence of the contour of the main pulmonary artery without significant change compared to prior imaging. No pleural effusion or pneumothorax. Cardiac and mediastinal contours are within normal limits. No acute fracture or lytic or blastic osseous lesions. The visualized upper abdominal bowel gas pattern is unremarkable. IMPRESSION: No acute cardiopulmonary process. Unchanged appearance of the chest compared to 06/19/2018. Electronically Signed   By: Malachy MoanHeath  McCullough M.D.   On: 10/05/2020 13:38    No results found.  No results found.    Assessment and Plan: Patient Active Problem List   Diagnosis Date Noted  . OSA on CPAP 11/30/2020  . CPAP use counseling 11/30/2020  . Asthma without status asthmaticus 06/19/2018  . Cardiac murmur 06/19/2018  . Dyspareunia, female 06/19/2018  . Exertional dyspnea 06/19/2018  . Former tobacco use 06/19/2018  . IBS (irritable bowel syndrome) 06/19/2018  . Knee pain 06/19/2018  . Pelvic pain in female 06/19/2018  . Migraine 06/19/2018  . Endometriosis of pelvis 09/27/2017  . Postoperative state 09/11/2017  . Radiculopathy of lumbar region 08/31/2016  . Anxiety 12/05/2013  . B12 deficiency 12/05/2013  . Depression 12/05/2013  . Neck pain 12/05/2013  . Obesity 12/05/2013   1. OSA on CPAP The patient does tolerate PAP and reports  definite benefit from PAP use.She has been under stress lately with an ill child. She is encouraged to use her cpap more consistently as she notes a clear benefit when she uses it. The patient was reminded how to clean equipment and advised to  Replace supplies regularly. The patient was also counselled on weight loss. The compliance is poor. The AHI is 0.5 . OSA- increase compliance with CPAP.     2. CPAP use counseling CPAP Counseling: had a lengthy  discussion with the patient regarding the importance of PAP therapy in management of the sleep apnea. Patient appears to understand the risk factor reduction and also understands the risks associated with untreated sleep apnea. Patient will try to make a good faith effort to remain compliant with therapy. Also instructed the patient on proper cleaning of the device including the water must be changed daily if possible and use of distilled water is preferred. Patient understands that the machine should be regularly cleaned with appropriate recommended cleaning solutions that do not damage the PAP machine for example given white vinegar and water rinses. Other methods such as ozone treatment may not be as good as these simple methods to achieve cleaning.   General Counseling: I have discussed the findings of the evaluation and examination with Sophira.  I have also discussed any further diagnostic evaluation thatmay be needed or ordered today. Reneta verbalizes understanding of the findings of todays visit. We also reviewed her medications today and discussed drug interactions and side effects including but not limited excessive drowsiness and altered mental states. We also discussed that there is always a risk not just to her but also people around her. she has been encouraged to call the office with any questions or concerns that should arise related to todays visit.  No orders of the defined types were placed in this encounter.       I have  personally obtained a history, examined the patient, evaluated laboratory and imaging results, formulated the assessment and plan and placed orders.   This patient was seen today by [name] Kluver, PA-C in collaboration with Dr. Freda Munro.  Yevonne Pax, MD [facility] Pulmonary and Critical Care Medicine Sleep medicine

## 2020-11-30 ENCOUNTER — Ambulatory Visit (INDEPENDENT_AMBULATORY_CARE_PROVIDER_SITE_OTHER): Payer: Medicaid Other | Admitting: Internal Medicine

## 2020-11-30 VITALS — BP 133/74 | HR 86 | Resp 16 | Ht 62.0 in | Wt 230.0 lb

## 2020-11-30 DIAGNOSIS — Z9989 Dependence on other enabling machines and devices: Secondary | ICD-10-CM

## 2020-11-30 DIAGNOSIS — Z7189 Other specified counseling: Secondary | ICD-10-CM | POA: Diagnosis not present

## 2020-11-30 DIAGNOSIS — G4733 Obstructive sleep apnea (adult) (pediatric): Secondary | ICD-10-CM

## 2020-11-30 NOTE — Patient Instructions (Signed)

## 2021-02-15 ENCOUNTER — Ambulatory Visit: Payer: Medicaid Other

## 2021-07-06 ENCOUNTER — Other Ambulatory Visit: Payer: Self-pay

## 2021-07-06 ENCOUNTER — Ambulatory Visit
Admission: EM | Admit: 2021-07-06 | Discharge: 2021-07-06 | Disposition: A | Discharge status: Home / Self Care | Payer: Medicaid Other | Attending: Emergency Medicine | Admitting: Emergency Medicine

## 2021-07-06 DIAGNOSIS — R0981 Nasal congestion: Secondary | ICD-10-CM | POA: Insufficient documentation

## 2021-07-06 DIAGNOSIS — R52 Pain, unspecified: Secondary | ICD-10-CM | POA: Insufficient documentation

## 2021-07-06 DIAGNOSIS — J111 Influenza due to unidentified influenza virus with other respiratory manifestations: Secondary | ICD-10-CM | POA: Diagnosis not present

## 2021-07-06 DIAGNOSIS — Z87891 Personal history of nicotine dependence: Secondary | ICD-10-CM | POA: Insufficient documentation

## 2021-07-06 DIAGNOSIS — R059 Cough, unspecified: Secondary | ICD-10-CM | POA: Diagnosis present

## 2021-07-06 DIAGNOSIS — R5383 Other fatigue: Secondary | ICD-10-CM | POA: Insufficient documentation

## 2021-07-06 DIAGNOSIS — Z20822 Contact with and (suspected) exposure to covid-19: Secondary | ICD-10-CM | POA: Diagnosis not present

## 2021-07-06 LAB — RESP PANEL BY RT-PCR (FLU A&B, COVID) ARPGX2
Influenza A by PCR: POSITIVE — AB
Influenza B by PCR: NEGATIVE
SARS Coronavirus 2 by RT PCR: NEGATIVE

## 2021-07-06 MED ORDER — ALBUTEROL SULFATE HFA 108 (90 BASE) MCG/ACT IN AERS: 1.0000 | INHALATION_SPRAY | Freq: Four times a day (QID) | RESPIRATORY_TRACT | 1 refills | Status: DC | PRN

## 2021-07-06 MED ORDER — IPRATROPIUM BROMIDE 0.06 % NA SOLN: 2.0000 | Freq: Four times a day (QID) | NASAL | 12 refills | Status: DC

## 2021-07-06 MED ORDER — OSELTAMIVIR PHOSPHATE 75 MG PO CAPS: 75.0000 mg | ORAL_CAPSULE | Freq: Two times a day (BID) | ORAL | 0 refills | Status: DC

## 2021-07-06 MED ORDER — BENZONATATE 100 MG PO CAPS: 200.0000 mg | ORAL_CAPSULE | Freq: Three times a day (TID) | ORAL | 0 refills | Status: DC

## 2021-07-06 MED ORDER — PROMETHAZINE-DM 6.25-15 MG/5ML PO SYRP: 5.0000 mL | ORAL_SOLUTION | Freq: Four times a day (QID) | ORAL | 0 refills | Status: DC | PRN

## 2021-07-06 NOTE — Discharge Instructions (Addendum)
Take the Tamiflu twice daily for 5 days for treatment of influenza.  Use the Atrovent nasal spray, 2 squirts up each nostril every 6 hours, as needed for nasal congestion and runny nose.  Use over-the-counter Delsym, Zarbee's, or Robitussin during the day as needed for cough.  Use the Tessalon Perles every 8 hours as needed for cough.  Taken with a small sip of water.  You may experience some numbness to your tongue or metallic taste in her mouth, this is normal.  Use the Promethazine DM cough syrup at bedtime as will make you drowsy but it should help dry up your postnasal drip and aid you in sleep and cough relief.  Return for reevaluation, or see your primary care provider, for new or worsening symptoms.  

## 2021-07-06 NOTE — ED Triage Notes (Signed)
Pt presents with body aches, back pain, fatigue, HA, cough x 3 days.  Temp 101.  Taking Nyquil.

## 2021-07-06 NOTE — ED Provider Notes (Signed)
[facility]    CSN: [id number] Arrival date & time: 07/06/21  0955      History   Chief Complaint Chief Complaint  Patient presents with   Cough   Generalized Body Aches    HPI [name] [name] is a 35 y.o. female.   HPI  35 year old female here for evaluation of respiratory complaints.  Patient reports that for last 2 days she has been experiencing a fever with a T-max of 101, headache, body aches, fatigue, runny nose nasal congestion, ear pain, sore throat, and a productive cough for yellow mucus.  She denies shortness of breath or wheezing, GI complaints, or known sick contacts.  Past Medical History:  Diagnosis Date   Abdominal cramping    Asthma    as a child   Headache    migraines   Heart murmur    asd murmur. saw cardiologist until age 55 and it was so small. rechecked 6 years ago and all was fine    Patient Active Problem List   Diagnosis Date Noted   OSA on CPAP 11/30/2020   CPAP use counseling 11/30/2020   Asthma without status asthmaticus 06/19/2018   Cardiac murmur 06/19/2018   Dyspareunia, female 06/19/2018   Exertional dyspnea 06/19/2018   Former tobacco use 06/19/2018   IBS (irritable bowel syndrome) 06/19/2018   Knee pain 06/19/2018   Pelvic pain in female 06/19/2018   Migraine 06/19/2018   Endometriosis of pelvis 09/27/2017   Postoperative state 09/11/2017   Radiculopathy of lumbar region 08/31/2016   Anxiety 12/05/2013   B12 deficiency 12/05/2013   Depression 12/05/2013   Neck pain 12/05/2013   Obesity 12/05/2013    Past Surgical History:  Procedure Laterality Date   KNEE ARTHROSCOPY Right 2004   LAPAROSCOPIC VAGINAL HYSTERECTOMY WITH SALPINGECTOMY Bilateral 09/11/2017   Procedure: LAPAROSCOPIC ASSISTED VAGINAL HYSTERECTOMY WITH SALPINGECTOMY;  Surgeon: Suzy Bouchard, MD;  Location: [facility];  Service: Gynecology;  Laterality: Bilateral;   LAPAROSCOPY N/A 08/03/2018   Procedure: LAPAROSCOPY OPERATIVE,;   Surgeon: Suzy Bouchard, MD;  Location: [facility];  Service: Gynecology;  Laterality: N/A;   LYSIS OF ADHESION N/A 08/03/2018   Procedure: LYSIS OF ADHESION;  Surgeon: Schermerhorn, Ihor Austin, MD;  Location: [facility];  Service: Gynecology;  Laterality: N/A;   TONSILLECTOMY  age 52    OB History   No obstetric history on file.      Home Medications    Prior to Admission medications   Medication Sig Start Date End Date Taking? Authorizing Provider  benzonatate (TESSALON) 100 MG capsule Take 2 capsules (200 mg total) by mouth every 8 (eight) hours. 07/06/21  Yes Becky Augusta, NP  cetirizine (ZYRTEC) 10 MG tablet Take by mouth.   Yes [provider]  EPINEPHrine 0.3 mg/0.3 mL IJ SOAJ injection See admin instructions. 09/03/20  Yes [provider]  famotidine (PEPCID) 20 MG tablet Take 1 tablet by mouth 2 (two) times daily. 11/04/20 11/04/21 Yes [provider]  FLUoxetine (PROZAC) 20 MG capsule Take by mouth. 12/02/19 07/06/21 Yes [provider]  ipratropium (ATROVENT) 0.06 % nasal spray Place 2 sprays into both nostrils 4 (four) times daily. 07/06/21  Yes Becky Augusta, NP  loratadine (CLARITIN) 10 MG tablet Take 10 mg by mouth daily.   Yes [provider]  montelukast (SINGULAIR) 10 MG tablet Take 1 tablet by mouth at bedtime. 11/04/20  Yes [provider]  oseltamivir (TAMIFLU) 75 MG capsule Take 1 capsule (75 mg total) by mouth  every 12 (twelve) hours. 07/06/21  Yes Becky Augusta, NP  promethazine-dextromethorphan (PROMETHAZINE-DM) 6.25-15 MG/5ML syrup Take 5 mLs by mouth 4 (four) times daily as needed. 07/06/21  Yes Becky Augusta, NP  albuterol (VENTOLIN HFA) 108 (90 Base) MCG/ACT inhaler Inhale 1-2 puffs into the lungs every 6 (six) hours as needed for up to 10 days for wheezing or shortness of breath. 07/06/21 07/16/21  Becky Augusta, NP    Family History Family History  Problem Relation Age of Onset   Cardiomyopathy Father     Migraines Mother    Breast cancer Maternal Grandmother 9    Social History Social History   Tobacco Use   Smoking status: Former    Types: Cigarettes    Quit date: 07/27/2014    Years since quitting: 6.9   Smokeless tobacco: Never  Vaping Use   Vaping Use: Never used  Substance Use Topics   Alcohol use: Yes    Alcohol/week: 0.0 standard drinks    Comment: rarely   Drug use: No     Allergies   Azithromycin, Flonase [fluticasone propionate], Fluticasone, Tiotropium, and Topamax [topiramate]   Review of Systems Review of Systems  Constitutional:  Positive for fatigue and fever. Negative for activity change and appetite change.  HENT:  Positive for congestion, ear pain, rhinorrhea and sore throat.   Respiratory:  Positive for cough. Negative for shortness of breath and wheezing.   Gastrointestinal:  Negative for diarrhea, nausea and vomiting.  Musculoskeletal:  Positive for arthralgias and myalgias.  Skin:  Negative for rash.  Neurological:  Positive for headaches.  Hematological: Negative.   Psychiatric/Behavioral: Negative.      Physical Exam Triage Vital Signs ED Triage Vitals  Enc Vitals Group     BP 07/06/21 1100 (!) 139/98     Pulse Rate 07/06/21 1100 (!) 105     Resp 07/06/21 1100 20     Temp 07/06/21 1100 100.1 F (37.8 C)     Temp Source 07/06/21 1100 Oral     SpO2 07/06/21 1100 100 %     Weight --      Height --      Head Circumference --      Peak Flow --      Pain Score 07/06/21 1058 9     Pain Loc --      Pain Edu? --      Excl. in GC? --    No data found.  Updated Vital Signs BP (!) 139/98 (BP Location: Left Arm)    Pulse (!) 105    Temp 100.1 F (37.8 C) (Oral)    Resp 20    LMP 01/16/2017    SpO2 100%   Visual Acuity Right Eye Distance:   Left Eye Distance:   Bilateral Distance:    Right Eye Near:   Left Eye Near:    Bilateral Near:     Physical Exam Vitals and nursing note reviewed.  Constitutional:      General: She is not in  acute distress.    Appearance: Normal appearance. She is not ill-appearing.  HENT:     Head: Normocephalic and atraumatic.     Right Ear: Tympanic membrane, ear canal and external ear normal. There is no impacted cerumen.     Left Ear: Tympanic membrane, ear canal and external ear normal. There is no impacted cerumen.     Nose: Congestion present. No rhinorrhea.     Mouth/Throat:     Mouth: Mucous membranes are moist.  Pharynx: Oropharynx is clear. Posterior oropharyngeal erythema present.  Cardiovascular:     Rate and Rhythm: Normal rate and regular rhythm.     Pulses: Normal pulses.     Heart sounds: Normal heart sounds. No murmur heard.   No gallop.  Pulmonary:     Effort: Pulmonary effort is normal.     Breath sounds: Normal breath sounds. No wheezing, rhonchi or rales.  Musculoskeletal:     Cervical back: Normal range of motion and neck supple.  Lymphadenopathy:     Cervical: No cervical adenopathy.  Skin:    General: Skin is warm and dry.     Capillary Refill: Capillary refill takes less than 2 seconds.     Findings: No erythema or rash.  Neurological:     General: No focal deficit present.     Mental Status: She is alert and oriented to person, place, and time.  Psychiatric:        Mood and Affect: Mood normal.        Behavior: Behavior normal.        Thought Content: Thought content normal.        Judgment: Judgment normal.     UC Treatments / Results  Labs (all labs ordered are listed, but only abnormal results are displayed) Labs Reviewed  RESP PANEL BY RT-PCR (FLU A&B, COVID) ARPGX2 - Abnormal; Notable for the following components:      Result Value   Influenza A by PCR POSITIVE (*)    All other components within normal limits    EKG   Radiology No results found.  Procedures Procedures (including critical care time)  Medications Ordered in UC Medications - No data to display  Initial Impression / Assessment and Plan / UC Course  I have  reviewed the triage vital signs and the nursing notes.  Pertinent labs & imaging results that were available during my care of the patient were reviewed by me and considered in my medical decision making (see chart for details).  Patient is a nontoxic-appearing 35 year old female here for evaluation of fever and respiratory symptoms as outlined in HPI above.  Patient ports her symptoms began 3 days ago and she is unaware of any exposure to COVID or influenza.  Her physical exam reveals protegrin tympanic membranes bilaterally with normal light reflex and clear external auditory canals.  Nasal mucosa is mildly erythematous and edematous without any significant discharge in either nare.  Oropharyngeal exam reveals mild posterior oropharyngeal erythema and injection with clear postnasal drip.  Tonsils are surgically absent.  No cervical lymphadenopathy appreciated on exam.  Cardiopulmonary exam reveals clear lung sounds in all fields.  Respiratory triplex panel was collected at triage and is pending.  Respiratory triplex panel is positive for influenza A.  Patient has had symptoms for 3 days but due to her history of heart murmur and asthma I will prescribe her Tamiflu for treatment of influenza A.  We will also give of Atrovent nasal spray, Tessalon Perles, and Promethazine DM cough syrup for cough and congestion.  Work note provided   Final Clinical Impressions(s) / UC Diagnoses   Final diagnoses:  Influenza     Discharge Instructions      Take the Tamiflu twice daily for 5 days for treatment of influenza.  Use the Atrovent nasal spray, 2 squirts up each nostril every 6 hours, as needed for nasal congestion and runny nose.  Use over-the-counter Delsym, Zarbee's, or Robitussin during the day as needed for cough.  Use the Tessalon Perles every 8 hours as needed for cough.  Taken with a small sip of water.  You may experience some numbness to your tongue or metallic taste in her mouth, this is  normal.  Use the Promethazine DM cough syrup at bedtime as will make you drowsy but it should help dry up your postnasal drip and aid you in sleep and cough relief.  Return for reevaluation, or see your primary care provider, for new or worsening symptoms.      ED Prescriptions     Medication Sig Dispense Auth. Provider   albuterol (VENTOLIN HFA) 108 (90 Base) MCG/ACT inhaler Inhale 1-2 puffs into the lungs every 6 (six) hours as needed for up to 10 days for wheezing or shortness of breath. 18 g Becky Augusta, NP   oseltamivir (TAMIFLU) 75 MG capsule Take 1 capsule (75 mg total) by mouth every 12 (twelve) hours. 10 capsule Becky Augusta, NP   benzonatate (TESSALON) 100 MG capsule Take 2 capsules (200 mg total) by mouth every 8 (eight) hours. 21 capsule Becky Augusta, NP   ipratropium (ATROVENT) 0.06 % nasal spray Place 2 sprays into both nostrils 4 (four) times daily. 15 mL Becky Augusta, NP   promethazine-dextromethorphan (PROMETHAZINE-DM) 6.25-15 MG/5ML syrup Take 5 mLs by mouth 4 (four) times daily as needed. 118 mL Becky Augusta, NP      PDMP not reviewed this encounter.   Becky Augusta, NP 07/06/21 1200

## 2021-08-11 DIAGNOSIS — R7989 Other specified abnormal findings of blood chemistry: Secondary | ICD-10-CM | POA: Insufficient documentation

## 2021-08-11 DIAGNOSIS — F429 Obsessive-compulsive disorder, unspecified: Secondary | ICD-10-CM | POA: Insufficient documentation

## 2021-12-02 ENCOUNTER — Ambulatory Visit
Admission: EM | Admit: 2021-12-02 | Discharge: 2021-12-02 | Disposition: A | Discharge status: Home / Self Care | Payer: Medicaid Other | Attending: Emergency Medicine | Admitting: Emergency Medicine

## 2021-12-02 ENCOUNTER — Other Ambulatory Visit: Payer: Self-pay

## 2021-12-02 DIAGNOSIS — J029 Acute pharyngitis, unspecified: Secondary | ICD-10-CM | POA: Insufficient documentation

## 2021-12-02 DIAGNOSIS — L259 Unspecified contact dermatitis, unspecified cause: Secondary | ICD-10-CM | POA: Diagnosis present

## 2021-12-02 LAB — GROUP A STREP BY PCR: Group A Strep by PCR: NOT DETECTED

## 2021-12-02 MED ORDER — HYDROXYZINE HCL 10 MG PO TABS: 10.0000 mg | ORAL_TABLET | Freq: Three times a day (TID) | ORAL | 0 refills | Status: DC | PRN

## 2021-12-02 MED ORDER — TRIAMCINOLONE ACETONIDE 0.1 % EX CREA: 1.0000 "application " | TOPICAL_CREAM | Freq: Two times a day (BID) | CUTANEOUS | 0 refills | Status: DC

## 2021-12-02 MED ORDER — PREDNISONE 10 MG (21) PO TBPK: ORAL_TABLET | Freq: Every day | ORAL | 0 refills | Status: DC

## 2021-12-02 NOTE — ED Triage Notes (Signed)
Patient is here for "st starting about 2 days ago, started as being itchy, now followed with congestion". Feel's like its closing up this morning. No fever. Ear pain this morning. No new/unexplained rash.

## 2021-12-02 NOTE — Discharge Instructions (Signed)
Continue to take Singulair daily Take Vistaril as needed for itching do not drive while taking

## 2021-12-02 NOTE — ED Provider Notes (Signed)
[facility]    CSN: [id number] Arrival date & time: 12/02/21  0840      History   Chief Complaint Chief Complaint  Patient presents with   Sore Throat   Nasal Congestion    HPI [name] [name] is a 36 y.o. female.   Patient presents today with sore throat and bilateral ear pressure since yesterday.  Patient states that she had allergy test done and is allergic to pine her apartment complex but now pine needles yesterday also.  Patient denies any shortness of breath no fevers no nausea vomiting diarrhea.  Took NyQuil last night with no relief.  Does take Singulair daily for allergies.  Patient also states on her right inner thigh and lower abdomen she has had a rash for approximately a week.  No new new detergents no new soaps.  States that the rash is very itchy in nature no blisters no bumps.  Has been using her daughter's cream with no relief.   Past Medical History:  Diagnosis Date   Abdominal cramping    Asthma    as a child   Headache    migraines   Heart murmur    asd murmur. saw cardiologist until age 85 and it was so small. rechecked 6 years ago and all was fine    Patient Active Problem List   Diagnosis Date Noted   Elevated serum creatinine 08/11/2021   Obsessive-compulsive disorder 08/11/2021   CPAP use counseling 11/30/2020   Gastroesophageal reflux disease without esophagitis 11/04/2020   Excessive daytime sleepiness 08/11/2020   Rebound headache 08/11/2020   Upper back pain, chronic 02/24/2020   Mild obstructive sleep apnea 12/31/2019   Atypical chest pain 12/30/2019   History of depression 09/05/2019   History of Lyme disease 09/05/2019   Seasonal allergic rhinitis 09/05/2019   Asthma without status asthmaticus 06/19/2018   Cardiac murmur 06/19/2018   Dyspareunia, female 06/19/2018   Exertional dyspnea 06/19/2018   Former tobacco use 06/19/2018   IBS (irritable bowel syndrome) 06/19/2018   Knee pain 06/19/2018   Pelvic pain in  female 06/19/2018   Migraine 06/19/2018   Endometriosis of pelvis 09/27/2017   Postoperative state 09/11/2017   Radiculopathy of lumbar region 08/31/2016   Anxiety 12/05/2013   Vitamin B12 deficiency 12/05/2013   Depression 12/05/2013   Neck pain 12/05/2013   BMI 40.0-44.9, adult (HCC) 12/05/2013    Past Surgical History:  Procedure Laterality Date   KNEE ARTHROSCOPY Right 2004   LAPAROSCOPIC VAGINAL HYSTERECTOMY WITH SALPINGECTOMY Bilateral 09/11/2017   Procedure: LAPAROSCOPIC ASSISTED VAGINAL HYSTERECTOMY WITH SALPINGECTOMY;  Surgeon: Suzy Bouchard, MD;  Location: [facility];  Service: Gynecology;  Laterality: Bilateral;   LAPAROSCOPY N/A 08/03/2018   Procedure: LAPAROSCOPY OPERATIVE,;  Surgeon: Suzy Bouchard, MD;  Location: [facility];  Service: Gynecology;  Laterality: N/A;   LYSIS OF ADHESION N/A 08/03/2018   Procedure: LYSIS OF ADHESION;  Surgeon: Schermerhorn, Ihor Austin, MD;  Location: [facility];  Service: Gynecology;  Laterality: N/A;   TONSILLECTOMY  age 59    OB History   No obstetric history on file.      Home Medications    Prior to Admission medications   Medication Sig Start Date End Date Taking? Authorizing Provider  clomiPRAMINE (ANAFRANIL) 25 MG capsule Take by mouth. 08/11/21 11/18/22 Yes [provider]  cyanocobalamin (,VITAMIN B-12,) 1000 MCG/ML injection Inject into the muscle. 08/19/21  Yes [provider]  famotidine (PEPCID) 20 MG tablet Take by mouth. 09/16/21  Yes  [provider]  ferrous sulfate 325 (65 FE) MG tablet Take 325 mg by mouth daily with breakfast.   Yes [provider]  FLUoxetine (PROZAC) 10 MG capsule Take by mouth. 11/18/21  Yes [provider]  FLUoxetine (PROZAC) 10 MG capsule Take by mouth. 11/18/21 11/18/22 Yes [provider]  hydrOXYzine (ATARAX) 10 MG tablet Take 1 tablet (10 mg total) by mouth 3 (three) times daily as needed. 12/02/21  Yes Maple Mirza L, NP  montelukast  (SINGULAIR) 10 MG tablet Take by mouth. 09/16/21  Yes [provider]  predniSONE (STERAPRED UNI-PAK 21 TAB) 10 MG (21) TBPK tablet Take by mouth daily. Take 6 tabs by mouth daily  for 2 days, then 5 tabs for 2 days, then 4 tabs for 2 days, then 3 tabs for 2 days, 2 tabs for 2 days, then 1 tab by mouth daily for 2 days 12/02/21  Yes Coralyn Mark, NP  tiotropium (SPIRIVA HANDIHALER) 18 MCG inhalation capsule Place into inhaler and inhale. 12/04/20  Yes [provider]  triamcinolone cream (KENALOG) 0.1 % Apply 1 application. topically 2 (two) times daily. 12/02/21  Yes Coralyn Mark, NP  albuterol (VENTOLIN HFA) 108 (90 Base) MCG/ACT inhaler Inhale 1-2 puffs into the lungs every 6 (six) hours as needed for up to 10 days for wheezing or shortness of breath. 07/06/21 07/16/21  Becky Augusta, NP  benzonatate (TESSALON) 100 MG capsule Take 2 capsules (200 mg total) by mouth every 8 (eight) hours. 07/06/21   Becky Augusta, NP  cetirizine (ZYRTEC) 10 MG tablet Take by mouth.    [provider]  clomiPRAMINE (ANAFRANIL) 25 MG capsule Take by mouth. 11/18/21   [provider]  clomiPRAMINE (ANAFRANIL) 50 MG capsule Take 50 mg by mouth at bedtime. 10/25/21   [provider]  clomiPRAMINE (ANAFRANIL) 75 MG capsule Take 75 mg by mouth at bedtime. 10/15/21   [provider]  cyanocobalamin (,VITAMIN B-12,) 1000 MCG/ML injection SMARTSIG:0.1 Milliliter(s) IM Every 2 Weeks 10/15/21   [provider]  EPINEPHrine 0.3 mg/0.3 mL IJ SOAJ injection See admin instructions. 09/03/20   [provider]  FLUoxetine (PROZAC) 20 MG capsule Take by mouth. 12/02/19 07/06/21  [provider]  ibuprofen (ADVIL) 800 MG tablet Take 800 mg by mouth 3 (three) times daily as needed. 10/28/21   [provider]  ipratropium (ATROVENT) 0.06 % nasal spray Place 2 sprays into both nostrils 4 (four) times daily. 07/06/21   Becky Augusta, NP  loratadine  (CLARITIN) 10 MG tablet Take 10 mg by mouth daily.    [provider]  meloxicam (MOBIC) 15 MG tablet Take 15 mg by mouth daily. 06/18/21   [provider]  methylPREDNISolone (MEDROL DOSEPAK) 4 MG TBPK tablet Take by mouth as directed. 08/26/21   [provider]  montelukast (SINGULAIR) 10 MG tablet Take 1 tablet by mouth at bedtime. 11/04/20   [provider]  nitrofurantoin, macrocrystal-monohydrate, (MACROBID) 100 MG capsule Take 100 mg by mouth 2 (two) times daily. 11/09/21   [provider]  oseltamivir (TAMIFLU) 75 MG capsule Take 1 capsule (75 mg total) by mouth every 12 (twelve) hours. 07/06/21   Becky Augusta, NP  promethazine-dextromethorphan (PROMETHAZINE-DM) 6.25-15 MG/5ML syrup Take 5 mLs by mouth 4 (four) times daily as needed. 07/06/21   Becky Augusta, NP  triamcinolone (NASACORT) 55 MCG/ACT AERO nasal inhaler 2 sprays daily. 10/15/21   [provider]    Family History Family History  Problem Relation  Age of Onset   Cardiomyopathy Father    Migraines Mother    Breast cancer Maternal Grandmother 61    Social History Social History   Tobacco Use   Smoking status: Former    Types: Cigarettes    Quit date: 07/27/2014    Years since quitting: 7.3   Smokeless tobacco: Never  Vaping Use   Vaping Use: Never used  Substance Use Topics   Alcohol use: Yes    Alcohol/week: 0.0 standard drinks    Comment: rarely   Drug use: No     Allergies   Azithromycin, Fluticasone, Fluticasone propionate, Tiotropium, and Topamax [topiramate]   Review of Systems Review of Systems  Constitutional:  Negative for fever.  HENT:  Positive for postnasal drip, rhinorrhea and sore throat. Negative for congestion.   Eyes: Negative.  Negative for photophobia, pain, discharge, redness, itching and visual disturbance.  Respiratory: Negative.    Cardiovascular: Negative.   Gastrointestinal: Negative.   Genitourinary: Negative.   Skin:  Positive  for rash.       Right inner thigh rash itchy and small area to lower abdomen  Neurological: Negative.     Physical Exam Triage Vital Signs ED Triage Vitals  Enc Vitals Group     BP 12/02/21 0859 126/88     Pulse Rate 12/02/21 0859 81     Resp 12/02/21 0859 18     Temp 12/02/21 0859 98.3 F (36.8 C)     Temp Source 12/02/21 0859 Oral     SpO2 12/02/21 0859 99 %     Weight 12/02/21 0856 232 lb (105.2 kg)     Height 12/02/21 0856 5\' 2"  (1.575 m)     Head Circumference --      Peak Flow --      Pain Score 12/02/21 0854 4     Pain Loc --      Pain Edu? --      Excl. in GC? --    No data found.  Updated Vital Signs BP 126/88 (BP Location: Left Arm)   Pulse 81   Temp 98.3 F (36.8 C) (Oral)   Resp 18   Ht 5\' 2"  (1.575 m)   Wt 232 lb (105.2 kg)   LMP 01/16/2017   SpO2 99%   BMI 42.43 kg/m   Visual Acuity Right Eye Distance:   Left Eye Distance:   Bilateral Distance:    Right Eye Near:   Left Eye Near:    Bilateral Near:     Physical Exam Constitutional:      Appearance: She is well-developed.  HENT:     Right Ear: Tympanic membrane normal. Tenderness present.     Left Ear: Tympanic membrane normal. Tenderness present.     Nose: No congestion or rhinorrhea.     Mouth/Throat:     Mouth: Mucous membranes are moist. No oral lesions.     Pharynx: Posterior oropharyngeal erythema present. No pharyngeal swelling.     Tonsils: No tonsillar exudate. 0 on the right. 0 on the left.  Eyes:     Conjunctiva/sclera: Conjunctivae normal.  Pulmonary:     Effort: Pulmonary effort is normal.  Musculoskeletal:     Cervical back: Normal range of motion.  Skin:    Capillary Refill: Capillary refill takes less than 2 seconds.     Findings: Erythema and rash present.  Neurological:     Mental Status: She is alert.     UC Treatments / Results  Labs (all labs  ordered are listed, but only abnormal results are displayed) Labs Reviewed  GROUP A STREP BY PCR     EKG   Radiology No results found.  Procedures Procedures (including critical care time)  Medications Ordered in UC Medications - No data to display  Initial Impression / Assessment and Plan / UC Course  I have reviewed the triage vital signs and the nursing notes.  Pertinent labs & imaging results that were available during my care of the patient were reviewed by me and considered in my medical decision making (see chart for details).     The sore throat and ear pain is most likely due to seasonal allergies and or pine needles.  Continue to take your Singulair daily Can take the Vistaril as needed for itching for the rash do not drive while taking this medication [place] can take 5 to 7 days to go away also use a topical Benadryl cream to help with itching.  Final Clinical Impressions(s) / UC Diagnoses   Final diagnoses:  Pharyngitis, unspecified etiology  Contact dermatitis, unspecified contact dermatitis type, unspecified trigger     Discharge Instructions      Continue to take Singulair daily Take Vistaril as needed for itching do not drive while taking      ED Prescriptions     Medication Sig Dispense Auth. Provider   hydrOXYzine (ATARAX) 10 MG tablet Take 1 tablet (10 mg total) by mouth 3 (three) times daily as needed. 30 tablet Maple MirzaMitchell, Adelisa Satterwhite L, NP   predniSONE (STERAPRED UNI-PAK 21 TAB) 10 MG (21) TBPK tablet Take by mouth daily. Take 6 tabs by mouth daily  for 2 days, then 5 tabs for 2 days, then 4 tabs for 2 days, then 3 tabs for 2 days, 2 tabs for 2 days, then 1 tab by mouth daily for 2 days 42 tablet Maple MirzaMitchell, Tresha Muzio L, NP   triamcinolone cream (KENALOG) 0.1 % Apply 1 application. topically 2 (two) times daily. 30 g Coralyn MarkMitchell, Whitlee Sluder L, NP      PDMP not reviewed this encounter.   Coralyn MarkMitchell, Kawanna Christley L, NP 12/02/21 [phone]

## 2022-03-15 ENCOUNTER — Ambulatory Visit (INDEPENDENT_AMBULATORY_CARE_PROVIDER_SITE_OTHER): Payer: Medicaid Other | Admitting: Urology

## 2022-03-15 ENCOUNTER — Other Ambulatory Visit (HOSPITAL_COMMUNITY): Payer: Self-pay | Admitting: Physician Assistant

## 2022-03-15 ENCOUNTER — Other Ambulatory Visit: Payer: Self-pay | Admitting: *Deleted

## 2022-03-15 ENCOUNTER — Encounter: Payer: Self-pay | Admitting: Urology

## 2022-03-15 ENCOUNTER — Other Ambulatory Visit
Admission: RE | Admit: 2022-03-15 | Discharge: 2022-03-15 | Disposition: A | Discharge status: Home / Self Care | Payer: Medicaid Other | Attending: Urology | Admitting: Urology

## 2022-03-15 ENCOUNTER — Other Ambulatory Visit: Payer: Self-pay | Admitting: Physician Assistant

## 2022-03-15 VITALS — BP 141/89 | HR 84 | Ht 62.0 in | Wt 240.0 lb

## 2022-03-15 DIAGNOSIS — N390 Urinary tract infection, site not specified: Secondary | ICD-10-CM | POA: Diagnosis not present

## 2022-03-15 DIAGNOSIS — G43701 Chronic migraine without aura, not intractable, with status migrainosus: Secondary | ICD-10-CM

## 2022-03-15 DIAGNOSIS — R3915 Urgency of urination: Secondary | ICD-10-CM

## 2022-03-15 LAB — BLADDER SCAN AMB NON-IMAGING

## 2022-03-15 LAB — URINALYSIS, COMPLETE (UACMP) WITH MICROSCOPIC
Bilirubin Urine: NEGATIVE
Glucose, UA: NEGATIVE mg/dL
Hgb urine dipstick: NEGATIVE
Ketones, ur: NEGATIVE mg/dL
Leukocytes,Ua: NEGATIVE
Nitrite: NEGATIVE
Protein, ur: NEGATIVE mg/dL
Specific Gravity, Urine: 1.015 (ref 1.005–1.030)
pH: 7.5 (ref 5.0–8.0)

## 2022-03-15 NOTE — Progress Notes (Signed)
   03/15/22 1:04 PM   [name] [name] [birth year] [id number]  CC: " Recurrent UTI," urinary urgency  HPI: 36 year old female who reports typically 1 episode per month of a few days of severe urinary urgency.  Urine cultures have been negative or showed mixed flora, and she has had no improvement with antibiotics through PCP.  She denies any gross hematuria or dysuria, and the primary issue is urinary urgency.  She is taking cranberry pills over the last few months as well as a vaginal probiotic.  She drew primarily water during the day.  Her history is notable for endometriosis with history of vaginal hysterectomy, as well as a diagnostic laparoscopy with Dr. Feliberto Gottron in January 2020 for lysis of adhesions.  She also reports pain with sexual activity.  Urinalysis today with 11-20 squamous cells, but otherwise benign.  No recent imaging to review.   PMH: Past Medical History:  Diagnosis Date   Abdominal cramping    Asthma    as a child   Headache    migraines   Heart murmur    asd murmur. saw cardiologist until age 31 and it was so small. rechecked 6 years ago and all was fine    Surgical History: Past Surgical History:  Procedure Laterality Date   KNEE ARTHROSCOPY Right 2004   LAPAROSCOPIC VAGINAL HYSTERECTOMY WITH SALPINGECTOMY Bilateral 09/11/2017   Procedure: LAPAROSCOPIC ASSISTED VAGINAL HYSTERECTOMY WITH SALPINGECTOMY;  Surgeon: Schermerhorn, Ihor Austin, MD;  Location: [facility];  Service: Gynecology;  Laterality: Bilateral;   LAPAROSCOPY N/A 08/03/2018   Procedure: LAPAROSCOPY OPERATIVE,;  Surgeon: Suzy Bouchard, MD;  Location: [facility];  Service: Gynecology;  Laterality: N/A;   LYSIS OF ADHESION N/A 08/03/2018   Procedure: LYSIS OF ADHESION;  Surgeon: Schermerhorn, Ihor Austin, MD;  Location: [facility];  Service: Gynecology;  Laterality: N/A;   TONSILLECTOMY  age 89      Family History: Family History  Problem Relation Age of Onset   Cardiomyopathy Father     Migraines Mother    Breast cancer Maternal Grandmother 42    Social History:  reports that she quit smoking about 7 years ago. Her smoking use included cigarettes. She has been exposed to tobacco smoke. She has never used smokeless tobacco. She reports current alcohol use. She reports that she does not use drugs.  Physical Exam: LMP 01/16/2017    Constitutional:  Alert and oriented, No acute distress. Cardiovascular: No clubbing, cyanosis, or edema. Respiratory: Normal respiratory effort, no increased work of breathing. GI: Abdomen is soft, nontender, nondistended, no abdominal masses  Laboratory Data: Reviewed, see HPI  Assessment & Plan:   36 year old female with 1-2 episodes of significant urinary urgency for around 4 days/month of unclear etiology.  Cultures have been negative.  She has a history of endometriosis.  Urinalysis today benign.  We discussed possible etiologies including endometriosis of the bladder, pelvic floor dysfunction, recurrent UTI, or interstitial cystitis.  I recommended cystoscopy to evaluate for any endometriosis of the bladder.  Agree with continuing cranberry tablets, I also gave her a list of foods to avoid if this is more related to IC, as well as some pelvic floor stretching exercises that would address more of a pelvic floor dysfunction.  Follow-up for cystoscopy If cystoscopy benign, consider referral to pelvic floor physical therapy  Legrand Rams, MD 03/15/2022  [facility] [address] [place], [place] [address] [phone]

## 2022-03-15 NOTE — Patient Instructions (Signed)
Pelvic Floor Dysfunction, Female  Pelvic floor dysfunction (PFD) is a condition that results when the group of muscles and connective tissues that support the organs in the pelvis (pelvic floor muscles) do not work well. These muscles and their connections form a sling that supports the colon and bladder. In women, they also support the uterus. PFD causes pelvic floor muscles to be too weak, too tight, or both. In PFD, muscle movements are not coordinated. This may cause bowel or bladder problems. It may also cause pain. What are the causes? This condition may be caused by an injury to the pelvic area or by a weakening of pelvic muscles. This often results from pregnancy and childbirth or other types of strain. In many cases, the exact cause is not known. What increases the risk? The following factors may make you more likely to develop this condition: Having chronic bladder tissue inflammation (interstitial cystitis). Being an older person. Being overweight. History of radiation treatment for cancer in the pelvic region. Previous pelvic surgery, such as removal of the uterus (hysterectomy). What are the signs or symptoms? Symptoms of this condition vary and may include: Bladder symptoms, such as: Trouble starting urination and emptying the bladder. Frequent urinary tract infections. Leaking urine when coughing, laughing, or exercising (stress incontinence). Having to pass urine urgently or frequently. Pain when passing urine. Bowel symptoms, such as: Constipation. Urgent or frequent bowel movements. Incomplete bowel movements. Painful bowel movements. Leaking stool or gas. Unexplained genital or rectal pain. Genital or rectal muscle spasms. Low back pain. Other symptoms may include: A heavy, full, or aching feeling in the vagina. A bulge that protrudes into the vagina. Pain during or after sex. How is this diagnosed? This condition may be diagnosed based on: Your symptoms and  medical history. A physical exam. During the exam, your health care provider may check your pelvic muscles for tightness, spasm, pain, or weakness. This may include a rectal exam and a pelvic exam. In some cases, you may have diagnostic tests, such as: Electrical muscle function tests. Urine flow testing. X-ray tests of bowel function. Ultrasound of the pelvic organs. How is this treated? Treatment for this condition depends on the symptoms. Treatment options include: Physical therapy. This may include Kegel exercises to help relax or strengthen the pelvic floor muscles. Biofeedback. This type of therapy provides feedback on how tight your pelvic floor muscles are so that you can learn to control them. Internal or external massage therapy. A treatment that involves electrical stimulation of the pelvic floor muscles to help control pain (transcutaneous electrical nerve stimulation, or TENS). Sound wave therapy (ultrasound) to reduce muscle spasms. Medicines, such as: Muscle relaxants. Bladder control medicines. Surgery to reconstruct or support pelvic floor muscles may be an option if other treatments do not help. Follow these instructions at home: Activity Do your usual activities as told by your health care provider. Ask your health care provider if you should modify any activities. Do pelvic floor strengthening or relaxing exercises at home as told by your physical therapist. Lifestyle Maintain a healthy weight. Eat foods that are high in fiber, such as beans, whole grains, and fresh fruits and vegetables. Limit foods that are high in fat and processed sugars, such as fried or sweet foods. Manage stress with relaxation techniques such as yoga or meditation. General instructions If you have problems with leakage: Use absorbable pads or wear padded underwear. Wash frequently with mild soap. Keep your genital and anal area as clean and dry as  possible. Ask your health care provider if  you should try a barrier cream to prevent skin irritation. Take warm baths to relieve pelvic muscle tension or spasms. Take over-the-counter and prescription medicines only as told by your health care provider. Keep all follow-up visits. How is this prevented? The cause of PFD is not always known, but there are a few things you can do to reduce the risk of developing this condition, including: Staying at a healthy weight. Getting regular exercise. Managing stress. Contact a health care provider if: Your symptoms are not improving with home care. You have signs or symptoms of PFD that get worse at home. You develop new signs or symptoms. You have signs of a urinary tract infection, such as: Fever. Chills. Increased urinary frequency. A burning feeling when urinating. You have not had a bowel movement in 3 days (constipation). Summary Pelvic floor dysfunction results when the muscles and connective tissues in your pelvic floor do not work well. These muscles and their connections form a sling that supports your colon and bladder. In women, they also support the uterus. PFD may be caused by an injury to the pelvic area or by a weakening of pelvic muscles. PFD causes pelvic floor muscles to be too weak, too tight, or a combination of both. Symptoms may vary from person to person. In most cases, PFD can be treated with physical therapies and medicines. Surgery may be an option if other treatments do not help. This information is not intended to replace advice given to you by your health care provider. Make sure you discuss any questions you have with your health care provider. Document Revised: 11/04/2020 Document Reviewed: 11/04/2020 Elsevier Patient Education  2023 [employer]    Eating Plan for Interstitial Cystitis Interstitial cystitis (IC) is a long-term (chronic) condition that causes pain and pressure in the bladder, the lower abdomen, and the pelvic area. Other symptoms of IC  include urinary urgency and frequency. Symptoms tend to come and go. Many people with IC find that certain foods trigger their symptoms. Different foods may be problematic for different people. Some foods are more likely to cause symptoms than others. Learning which foods bother you and which do not can help you come up with an eating plan to manage IC. What are tips for following this plan? You may find it helpful to work with a dietitian. A dietician can help you develop an eating plan by doing an elimination diet. This diet involves: Creating a list of foods that you think trigger your IC symptoms combined with the foods that most commonly trigger symptoms for many people with IC. It may take several months to find out which foods bother you. Eliminating those foods from your diet for about one month, then reintroducing the foods one at a time to see which ones trigger your symptoms. Reading food labels Once you know which foods trigger your IC symptoms, you can avoid them. However, it is also a good idea to read food labels because some foods that trigger your symptoms may be included as ingredients in other foods. These ingredients may include: Soy. Worcestershire sauce. Vinegar. Alcohol. Artificial sweeteners. Monosodium glutamate. Other potential triggers include: Chili peppers. Tomato products. Citrus fruits, flavors, or juices. Shopping Shopping can be a challenge if many foods trigger your IC. When you go grocery shopping, bring a list of the foods you cannot eat. You can get an app for your phone that lets you know which foods are the safest and  which you may want to avoid. You can find the app at the Interstitial Cystitis Network website: www.ic-network.com Meal planning Plan your meals according to the results of your elimination diet. If you have not done an elimination diet, plan meals according to IC food lists recommended by your health care provider or dietitian. These lists  tell you which foods are least and most likely to cause symptoms. Avoid certain types of food when you go out to eat, such as pizza and foods typically served at [place], [place], and [place]. These foods often contain ingredients that can aggravate IC. General information Here are some general guidelines for an IC eating plan: Do not eat large portions. Drink plenty of fluids with your meals. Do not eat foods that are high in sugar, salt, or saturated fat. Choose whole fruits instead of juice. Eat a colorful variety of vegetables. What foods should I eat? For people with IC, the best diet is a balanced one that includes things from all the food groups. Even if you have to avoid certain foods, there are still plenty of healthy choices in each group. The following are some foods that are least bothersome and may be safest to eat: Fruits Bananas. Blueberries and blueberry juice. Melons. Pears. Apples. Dates. Prunes. Raisins. Apricots. Vegetables Asparagus. Avocado. Celery. Beets. Bell peppers. Black olives. Broccoli. Brussels sprouts. Cabbage. Carrots. Cauliflower. Cucumber. Eggplant. Green beans. Potatoes. Radishes. Spinach. Squash. Turnips. Zucchini. Mushrooms. Peas. Grains Oats. Rice. Bran. Oatmeal. Whole wheat bread. Meats and other proteins Beef. Fish and other seafood. Eggs. Nuts. Peanut butter. Pork. Poultry. Lamb. Garbanzo beans. Pinto beans. Dairy Whole or low-fat milk. American, mozzarella, mild cheddar, feta, ricotta, and cream cheeses. The items listed above may not be a complete list of foods and beverages you can eat. Contact a dietitian for more information. What foods should I avoid? You should avoid any foods that seem to trigger your symptoms. It is also a good idea to avoid foods that are most likely to cause symptoms in many people with IC. These include the following: Fruits Citrus fruits, including lemons, limes, oranges, and grapefruit. Cranberries. Strawberries.  Pineapple. Kiwi. Vegetables Chili peppers. Onions. Sauerkraut. Tomato and tomato products. Rosita Fire. Grains You do not need to avoid any type of grain unless it triggers your symptoms. Meats and other proteins Precooked or cured meats, such as sausages or meat loaves. Soy products. Dairy Chocolate ice cream. Processed cheese. Yogurt. Beverages Alcohol. Chocolate drinks. Coffee. Cranberry juice. Carbonated drinks. Tea (black, green, or herbal). Tomato juice. Sports drinks. The items listed above may not be a complete list of foods and beverages you should avoid. Contact a dietitian for more information. Summary Many people with IC find that certain foods trigger their symptoms. Different foods may be problematic for different people. You may find it helpful to work with a dietitian to do an elimination diet and come up with an eating plan that is right for you. Plan your meals according to the results of your elimination diet. If you have not done an elimination diet, plan your meals using IC food lists. These lists tell you which foods are least and most likely to cause symptoms. The best diet for people with IC is a balanced diet that includes foods from all the food groups. Even if you have to avoid certain foods, there are still plenty of healthy choices in each group. This information is not intended to replace advice given to you by your health care provider. Make sure  you discuss any questions you have with your health care provider. Document Revised: 08/01/2021 Document Reviewed: 08/01/2021 Elsevier Patient Education  2023 [employer].

## 2022-03-17 ENCOUNTER — Ambulatory Visit
Admission: RE | Admit: 2022-03-17 | Discharge: 2022-03-17 | Disposition: A | Discharge status: Home / Self Care | Payer: Medicaid Other | Source: Ambulatory Visit | Attending: Physician Assistant | Admitting: Physician Assistant

## 2022-03-17 DIAGNOSIS — G43701 Chronic migraine without aura, not intractable, with status migrainosus: Secondary | ICD-10-CM | POA: Diagnosis present

## 2022-03-30 ENCOUNTER — Ambulatory Visit (INDEPENDENT_AMBULATORY_CARE_PROVIDER_SITE_OTHER): Payer: Medicaid Other | Admitting: Urology

## 2022-03-30 ENCOUNTER — Encounter: Payer: Self-pay | Admitting: Urology

## 2022-03-30 VITALS — BP 122/86 | HR 87 | Ht 62.0 in | Wt 238.0 lb

## 2022-03-30 DIAGNOSIS — R3915 Urgency of urination: Secondary | ICD-10-CM | POA: Diagnosis not present

## 2022-03-30 DIAGNOSIS — M6289 Other specified disorders of muscle: Secondary | ICD-10-CM

## 2022-03-30 NOTE — Progress Notes (Signed)
Cystoscopy Procedure Note:  Indication: Urinary urgency, rule out bladder endometriosis  After informed consent and discussion of the procedure and its risks, [name] [name] was positioned and prepped in the standard fashion. Cystoscopy was performed with a flexible cystoscope. The urethra, bladder neck and entire bladder was visualized in a standard fashion.  The ureteral orifices were visualized in their normal location and orientation.  Bladder mucosa grossly normal throughout, no abnormal findings, no abnormalities on retroflexion  Findings: Normal cystoscopy  Assessment and Plan: Referral placed to pelvic floor physical therapy [facility] 42-month symptom check  Nickolas Madrid, MD 03/30/2022

## 2022-04-06 ENCOUNTER — Ambulatory Visit: Payer: Medicaid Other | Attending: Urology | Admitting: Physical Therapy

## 2022-04-06 ENCOUNTER — Encounter: Payer: Self-pay | Admitting: Physical Therapy

## 2022-04-06 DIAGNOSIS — M6281 Muscle weakness (generalized): Secondary | ICD-10-CM | POA: Diagnosis present

## 2022-04-06 DIAGNOSIS — R102 Pelvic and perineal pain unspecified side: Secondary | ICD-10-CM

## 2022-04-06 DIAGNOSIS — M6289 Other specified disorders of muscle: Secondary | ICD-10-CM | POA: Diagnosis not present

## 2022-04-06 DIAGNOSIS — R278 Other lack of coordination: Secondary | ICD-10-CM

## 2022-04-06 NOTE — Therapy (Signed)
OUTPATIENT PHYSICAL THERAPY FEMALE PELVIC EVALUATION   Patient Name: [name] [name] MRN: [id number] DOB:April 28, 1986, 36 y.o., female Today's Date: 04/06/2022   PT End of Session - 04/06/22 0944     Visit Number 1    Number of Visits 12    Date for PT Re-Evaluation 06/29/22    Authorization Type IE 04/06/2022    PT Start Time 0945    PT Stop Time 1030    PT Time Calculation (min) 45 min    Activity Tolerance Patient tolerated treatment well    Behavior During Therapy WFL for tasks assessed/performed             Past Medical History:  Diagnosis Date   Abdominal cramping    Asthma    as a child   Headache    migraines   Heart murmur    asd murmur. saw cardiologist until age 44 and it was so small. rechecked 6 years ago and all was fine   Past Surgical History:  Procedure Laterality Date   KNEE ARTHROSCOPY Right 2004   LAPAROSCOPIC VAGINAL HYSTERECTOMY WITH SALPINGECTOMY Bilateral 09/11/2017   Procedure: LAPAROSCOPIC ASSISTED VAGINAL HYSTERECTOMY WITH SALPINGECTOMY;  Surgeon: Schermerhorn, Ihor Austin, MD;  Location: [facility];  Service: Gynecology;  Laterality: Bilateral;   LAPAROSCOPY N/A 08/03/2018   Procedure: LAPAROSCOPY OPERATIVE,;  Surgeon: Suzy Bouchard, MD;  Location: [facility];  Service: Gynecology;  Laterality: N/A;   LYSIS OF ADHESION N/A 08/03/2018   Procedure: LYSIS OF ADHESION;  Surgeon: Schermerhorn, Ihor Austin, MD;  Location: [facility];  Service: Gynecology;  Laterality: N/A;   TONSILLECTOMY  age 65   Patient Active Problem List   Diagnosis Date Noted   Elevated serum creatinine 08/11/2021   Obsessive-compulsive disorder 08/11/2021   CPAP use counseling 11/30/2020   Gastroesophageal reflux disease without esophagitis 11/04/2020   Excessive daytime sleepiness 08/11/2020   Rebound headache 08/11/2020   Upper back pain, chronic 02/24/2020   Mild obstructive sleep apnea 12/31/2019   Atypical chest pain 12/30/2019   History of depression 09/05/2019    History of Lyme disease 09/05/2019   Seasonal allergic rhinitis 09/05/2019   Asthma without status asthmaticus 06/19/2018   Cardiac murmur 06/19/2018   Dyspareunia, female 06/19/2018   Exertional dyspnea 06/19/2018   Former tobacco use 06/19/2018   IBS (irritable bowel syndrome) 06/19/2018   Knee pain 06/19/2018   Pelvic pain in female 06/19/2018   Migraine 06/19/2018   Endometriosis of pelvis 09/27/2017   Postoperative state 09/11/2017   Radiculopathy of lumbar region 08/31/2016   Anxiety 12/05/2013   Vitamin B12 deficiency 12/05/2013   Depression 12/05/2013   Neck pain 12/05/2013   BMI 40.0-44.9, adult (HCC) 12/05/2013    PCP: [facility]  REFERRING PROVIDER: Sondra Come, MD  REFERRING DIAG: M62.89 (ICD-10-CM) - Pelvic floor dysfunction in female   THERAPY DIAG:  Other lack of coordination  Pelvic pain  Muscle weakness (generalized)  Rationale for Evaluation and Treatment Rehabilitation  PRECAUTIONS: None  WEIGHT BEARING RESTRICTIONS No  FALLS:  Has patient fallen in last 6 months? No  ONSET DATE: 2018  SUBJECTIVE:  CHIEF COMPLAINT: Patient notes that she has had longstanding abdominal pain 2/2 to endometriosis. Patient was referred because of consistent, monthly UTI symptoms that were not found to be related to bacterial. Patient was cleared from infection and from endometrial adhesions on the bladder. Patient notes symptoms include painful sex with deep penetration, increased urinary urgency and frequency after penetrative sex. Patient also deals with SUI.    PERTINENT HISTORY/CHART REVIEW:  Red flags (bowel/bladder changes, saddle paresthesia, personal history of cancer, h/o spinal tumors, h/o compression fx, h/o abdominal aneurysm, abdominal pain,  chills/fever, night sweats, nausea, vomiting, unrelenting pain, first onset of insidious LBP <20 y/o): Negative  02/2022 Urology Note: "36 year old female who reports typically 1 episode per month of a few days of severe urinary urgency.  Urine cultures have been negative or showed mixed flora, and she has had no improvement with antibiotics through PCP.  She denies any gross hematuria or dysuria, and the primary issue is urinary urgency.  She is taking cranberry pills over the last few months as well as a vaginal probiotic.  She drew primarily water during the day.  Her history is notable for endometriosis with history of vaginal hysterectomy, as well as a diagnostic laparoscopy with Dr. Ouida Sills in January 2020 for lysis of adhesions.  She also reports pain with sexual activity.   Urinalysis today with 11-20 squamous cells, but otherwise benign.  No recent imaging to review."   PAIN:  Are you having pain? Yes NPRS scale:  2/10 (current) suprapubic 2/10 (least) 9/10 (worst)- around cycle with cramping, with false UTIs Pain location: suprapubic (even with vaginal penetration) Pain type: constant Pain descriptors: stabbing Pain duration:   Aggravating factors: penetration, false UTIs Relieving factors: heating pad, fetal position  OCCUPATION/LEISURE ACTIVITIES:  Nail tech  PLOF:  Independent  PATIENT GOALS: "Less pain"  OBSTETRICAL HISTORY: G2P2 Deliveries: SVD Tearing/Episiotomy: none  GYNECOLOGICAL HISTORY: Hysterectomy: Yes Vaginal Endometriosis: Positive for history Last Menstrual Period:  Pain with exam: Yes  Prolapse: None Heaviness/pressure: No   UROLOGICAL HISTORY: Frequency of urination: every 2 hours; flare up every 30 min Incontinence: Coughing, Sneezing, and Laughing  Onset: 2013 Amount: Min Protective undergarments: No   Fluid Intake: ~ 50 oz H20, every other day coffee caffeinated, 1 cup juices- cranberry Nocturia: 1-2x/night Toileting posture: heels  elevated Incomplete emptying: No  Pain with urination: Positive for suprapubic pain with flare up Stream: Strong; occasional spraying  Urgency: Yes (flare up) Difficulty initiating urination: Negative Intermittent stream: Negative Frequent UTI: Positive for suspicion, but urinalysis has been more often negative.   GASTROINTESTINAL HISTORY: Type of bowel movement: (Bristol Stool Scale) 6-7 Frequency of BMs: at least 3x/day Incomplete bowel movement: Yes (IBS) Pain with defecation: Positive for skin irritation. Straining with defecation: Negative Hemorrhoids: unsure, but feels they might exist  Toileting posture: heels elevated Incontinence: Negative.   SEXUAL HISTORY AND FUNCTION: Sexually active: Yes  Pain with penetration: deep thrusting, post-coital, and interrupts sexual activity Pain with external stimulation: No  Change in ability to achieve orgasm: Yes  Sexual abuse: No    OBJECTIVE:  DIAGNOSTIC TESTING/IMAGING: Normal cystoscopy   COGNITION:  Patient is oriented to person, place, and time.  Recent memory is intact.  Remote memory is intact.  Attention span and concentration are intact.  Expressive speech is intact.  Patient's fund of knowledge is within normal limits for educational level.    POSTURE/OBSERVATIONS:   Lumbar lordosis: WNL Thoracic kyphosis: WNL Iliac crest height: not formally assessed  Lumbar lateral shift:  not formally assessed  Pelvic obliquity: not formally assessed  Leg length discrepancy: not formally assessed    GAIT:  Grossly WFL; no apparent/significant aberrations noted.    RANGE OF MOTION: deferred 2/2 to time constraints  AROM (Normal range in degrees) AROM  04/06/2022  Lumbar   Flexion (65)   Extension (30)   Right lateral flexion (25)   Left lateral flexion (25)   Right rotation (30)   Left rotation (30)       Hip LEFT RIGHT  Flexion (125)    Extension (15)    Abduction (40)    Adduction     Internal Rotation  (45)    External Rotation (45)    (* = pain; blank rows = not tested)   SENSATION: deferred 2/2 to time constraints  Grossly intact to light touch bilateral LEs as determined by testing dermatomes L2-S2 Proprioception and hot/cold testing deferred on this date   STRENGTH: MMT deferred 2/2 to time constraints   RLE LLE  Hip Flexion    Hip Extension    Hip Abduction     Hip Adduction     Hip ER     Hip IR     Knee Extension    Knee Flexion    Dorsiflexion     Plantarflexion (seated)    (* = pain; blank rows = not tested)   MUSCLE LENGTH: deferred 2/2 to time constraints    ABDOMINAL: deferred 2/2 to time constraints  Palpation: Diastasis: Scar mobility: Rib flare:   SPECIAL TESTS: deferred 2/2 to time constraints   PALPATION: deferred 2/2 to time constraints  LOCATION LEFT  RIGHT           Lumbar paraspinals    Quadratus Lumborum    Gluteus Maximus    Gluteus Medius    Deep hip external rotators    PSIS    Fortin's Area (SIJ)    Greater Trochanter    ASIS    Sacral border    Coccyx    Ischial tuberosity    (blank rows = not tested) Graded on 0-4 scale (0 = no pain, 1 = pain, 2 = pain with wincing/grimacing/flinching, 3 = pain with withdrawal, 4 = unwilling to allow palpation)   PHYSICAL PERFORMANCE MEASURES:  STS: WFL Deep Squat: RLE STS: LLE STS:  : 5TSTS:     EXTERNAL PELVIC EXAM: deferred 2/2 to time constraints None given; testing deferred to later date  Breath coordination: Voluntary Contraction: present/absent Relaxation: full/delayed/non-relaxing Perineal movement with sustained IAP increase ("bear down"): descent/no change/elevation/excessive descent Perineal movement with rapid IAP increase ("cough"): elevation/no change/descent Palpation of bulbocavernosus: Palpation of ischiocavernosus: Palpation of pubic symphysis: Palpation of superficial transverse perineal:   INTERNAL VAGINAL EXAM: deferred 2/2 to time  constraints None given; testing deferred to later date  Introitus Appears:  Skin integrity:  Scar mobility: Strength (PERF): /5 Laycock MMT,  sec x  reps,  fast twitch Symmetry: Palpation: Prolapse: (0 no contraction, 1 flicker, 2 weak squeeze and no lift, 3 fair squeeze and definite lift, 4 good squeeze and lift against resistance, 5 strong squeeze against strong resistance)   RECTAL EXAM: not indicated  Anal wink: present/absent Symmetry: Palpation: Strength (PERF): /5 Laycock MMT,  sec x  reps, fast twitch (0 no contraction, 1 flicker, 2 weak squeeze and no lift, 3 fair squeeze and definite lift, 4 good squeeze and lift against resistance, 5 strong squeeze against strong resistance)   PATIENT EDUCATION:  Patient educated on prognosis,  POC, and provided with HEP including: not initiated. Patient articulated understanding and returned demonstration. Patient will benefit from further education in order to maximize compliance and understanding for long-term therapeutic gains.   PATIENT SURVEYS:  FOTO Urinary Problem 67; PFDI Pain 33; PFDI Urinary 17  ASSESSMENT:  Clinical Impression: Patient is a 36 year old presenting to clinic with chief complaints of pelvic pain and urinary frequency with incontinence. Today's evaluation is suggestive of deficits in PFM coordination, PFM strength, pain, and IAP management as evidenced by SUI, 9/10 worst pain, pain with deep penetration. Patient's responses on FOTO outcome measures (Urinary Problem 67; PFDI Pain 33; PFDI Urinary 17) indicate significant functional limitations/disability/distress. Patient's progress may be limited due to persistence of complaint and comorbidities; however, patient's motivation is advantageous. Patient will benefit from continued skilled therapeutic intervention to address deficits in PFM coordination, PFM strength, pain, and IAP management  in order to increase function and improve overall QOL.   Objective  impairments: decreased activity tolerance, decreased coordination, decreased endurance, decreased knowledge of condition, increased muscle spasms, improper body mechanics, and pain.   Activity limitations: interpersonal relationship, driving, shopping, and community activity.   Personal factors: Behavior pattern, Past/current experiences, Time since onset of injury/illness/exacerbation, and 3+ comorbidities: migraines, OSA, IBS, GERD, anxiety, depression, OCD  are also affecting patient's functional outcome.   Rehab Potential: Good  Clinical decision making: Evolving/moderate complexity  Evaluation complexity: Moderate   GOALS: Goals reviewed with patient? Yes  SHORT TERM GOALS: Target date: 05/18/2022  Patient will demonstrate independence with HEP in order to maximize therapeutic gains and improve carryover from physical therapy sessions to ADLs in the home and community. Baseline: not initiated Goal status: INITIAL  LONG TERM GOALS: Target date: 06/29/2022  Patient will demonstrate improved function as evidenced by a score of 73 on FOTO measure for full participation in activities at home and in the community.  Baseline: 61 Goal status: INITIAL  Patient will decrease worst pain as reported on NPRS by at least 2 points to demonstrate clinically significant reduction in pain in order to restore/improve function and overall QOL. Baseline: 9/10 Goal status: INITIAL  Patient will demonstrate coordinated lengthening and relaxation of PFM with diaphragmatic inhalation in order to decrease spasm and allow for unrestricted elimination of urine/feces for improved overall QOL. Baseline: not formally assessed  Goal status: INITIAL   PLAN: Rehab frequency: 1x/week  Rehab duration: 12 weeks  Planned interventions: Therapeutic exercises, Therapeutic activity, Neuromuscular re-education, Balance training, Gait training, Patient/Family education, Self Care, Joint mobilization, Electrical  stimulation, Spinal mobilization, Cryotherapy, Moist heat, scar mobilization, Taping, and Manual therapy     Sheria Lang PT, DPT [phone]  04/06/2022, 9:45 AM

## 2022-04-13 ENCOUNTER — Encounter: Payer: Self-pay | Admitting: Physical Therapy

## 2022-04-13 ENCOUNTER — Ambulatory Visit: Payer: Medicaid Other | Attending: Urology | Admitting: Physical Therapy

## 2022-04-13 DIAGNOSIS — M6281 Muscle weakness (generalized): Secondary | ICD-10-CM | POA: Insufficient documentation

## 2022-04-13 DIAGNOSIS — R102 Pelvic and perineal pain: Secondary | ICD-10-CM | POA: Insufficient documentation

## 2022-04-13 DIAGNOSIS — R278 Other lack of coordination: Secondary | ICD-10-CM | POA: Insufficient documentation

## 2022-04-13 NOTE — Therapy (Signed)
OUTPATIENT PHYSICAL THERAPY FEMALE PELVIC TREATMENT   Patient Name: [name] [name] MRN: [id number] DOB:12-Oct-1985, 36 y.o., female Today's Date: 04/13/2022   PT End of Session - 04/13/22 1115     Visit Number 2    Number of Visits 12    Date for PT Re-Evaluation 06/29/22    Authorization Type IE 04/06/2022    PT Start Time 1115    PT Stop Time 1155    PT Time Calculation (min) 40 min    Activity Tolerance Patient tolerated treatment well    Behavior During Therapy WFL for tasks assessed/performed             Past Medical History:  Diagnosis Date   Abdominal cramping    Asthma    as a child   Headache    migraines   Heart murmur    asd murmur. saw cardiologist until age 79 and it was so small. rechecked 6 years ago and all was fine   Past Surgical History:  Procedure Laterality Date   KNEE ARTHROSCOPY Right 2004   LAPAROSCOPIC VAGINAL HYSTERECTOMY WITH SALPINGECTOMY Bilateral 09/11/2017   Procedure: LAPAROSCOPIC ASSISTED VAGINAL HYSTERECTOMY WITH SALPINGECTOMY;  Surgeon: Schermerhorn, Gwen Her, MD;  Location: [facility];  Service: Gynecology;  Laterality: Bilateral;   LAPAROSCOPY N/A 08/03/2018   Procedure: LAPAROSCOPY OPERATIVE,;  Surgeon: Boykin Nearing, MD;  Location: [facility];  Service: Gynecology;  Laterality: N/A;   LYSIS OF ADHESION N/A 08/03/2018   Procedure: LYSIS OF ADHESION;  Surgeon: Schermerhorn, Gwen Her, MD;  Location: [facility];  Service: Gynecology;  Laterality: N/A;   TONSILLECTOMY  age 44   Patient Active Problem List   Diagnosis Date Noted   Elevated serum creatinine 08/11/2021   Obsessive-compulsive disorder 08/11/2021   CPAP use counseling 11/30/2020   Gastroesophageal reflux disease without esophagitis 11/04/2020   Excessive daytime sleepiness 08/11/2020   Rebound headache 08/11/2020   Upper back pain, chronic 02/24/2020   Mild obstructive sleep apnea 12/31/2019   Atypical chest pain 12/30/2019   History of depression 09/05/2019    History of Lyme disease 09/05/2019   Seasonal allergic rhinitis 09/05/2019   Asthma without status asthmaticus 06/19/2018   Cardiac murmur 06/19/2018   Dyspareunia, female 06/19/2018   Exertional dyspnea 06/19/2018   Former tobacco use 06/19/2018   IBS (irritable bowel syndrome) 06/19/2018   Knee pain 06/19/2018   Pelvic pain in female 06/19/2018   Migraine 06/19/2018   Endometriosis of pelvis 09/27/2017   Postoperative state 09/11/2017   Radiculopathy of lumbar region 08/31/2016   Anxiety 12/05/2013   Vitamin B12 deficiency 12/05/2013   Depression 12/05/2013   Neck pain 12/05/2013   BMI 40.0-44.9, adult ([facility]) 12/05/2013    PCP: [facility]  REFERRING PROVIDER: Billey Co, MD  REFERRING DIAG: M62.89 (ICD-10-CM) - Pelvic floor dysfunction in female   THERAPY DIAG:  Other lack of coordination  Pelvic pain  Muscle weakness (generalized)  Rationale for Evaluation and Treatment Rehabilitation  PRECAUTIONS: None  WEIGHT BEARING RESTRICTIONS No  FALLS:  Has patient fallen in last 6 months? No  ONSET DATE: 2018  Initial Evaluation: SUBJECTIVE:  CHIEF COMPLAINT: Patient notes that she has had longstanding abdominal pain 2/2 to endometriosis. Patient was referred because of consistent, monthly UTI symptoms that were not found to be related to bacterial. Patient was cleared from infection and from endometrial adhesions on the bladder. Patient notes symptoms include painful sex with deep penetration, increased urinary urgency and frequency after penetrative sex. Patient also deals with SUI.    PERTINENT HISTORY/CHART REVIEW:  Red flags (bowel/bladder changes, saddle paresthesia, personal history of cancer, h/o spinal tumors, h/o compression fx, h/o abdominal aneurysm,  abdominal pain, chills/fever, night sweats, nausea, vomiting, unrelenting pain, first onset of insidious LBP <20 y/o): Negative  02/2022 Urology Note: "36 year old female who reports typically 1 episode per month of a few days of severe urinary urgency.  Urine cultures have been negative or showed mixed flora, and she has had no improvement with antibiotics through PCP.  She denies any gross hematuria or dysuria, and the primary issue is urinary urgency.  She is taking cranberry pills over the last few months as well as a vaginal probiotic.  She drew primarily water during the day.  Her history is notable for endometriosis with history of vaginal hysterectomy, as well as a diagnostic laparoscopy with Dr. Ouida Sills in January 2020 for lysis of adhesions.  She also reports pain with sexual activity.   Urinalysis today with 11-20 squamous cells, but otherwise benign.  No recent imaging to review."   PAIN:  Are you having pain? Yes NPRS scale:  2/10 (current) suprapubic 2/10 (least) 9/10 (worst)- around cycle with cramping, with false UTIs Pain location: suprapubic (even with vaginal penetration) Pain type: constant Pain descriptors: stabbing Pain duration:   Aggravating factors: penetration, false UTIs Relieving factors: heating pad, fetal position  OCCUPATION/LEISURE ACTIVITIES:  Nail tech  PLOF:  Independent  PATIENT GOALS: "Less pain"  OBSTETRICAL HISTORY: G2P2 Deliveries: SVD Tearing/Episiotomy: none  GYNECOLOGICAL HISTORY: Hysterectomy: Yes Vaginal Endometriosis: Positive for history Last Menstrual Period:  Pain with exam: Yes  Prolapse: None Heaviness/pressure: No   UROLOGICAL HISTORY: Frequency of urination: every 2 hours; flare up every 30 min Incontinence: Coughing, Sneezing, and Laughing  Onset: 2013 Amount: Min Protective undergarments: No   Fluid Intake: ~ 50 oz H20, every other day coffee caffeinated, 1 cup juices- cranberry Nocturia:  1-2x/night Toileting posture: heels elevated Incomplete emptying: No  Pain with urination: Positive for suprapubic pain with flare up Stream: Strong; occasional spraying  Urgency: Yes (flare up) Difficulty initiating urination: Negative Intermittent stream: Negative Frequent UTI: Positive for suspicion, but urinalysis has been more often negative.   GASTROINTESTINAL HISTORY: Type of bowel movement: (Bristol Stool Scale) 6-7 Frequency of BMs: at least 3x/day Incomplete bowel movement: Yes (IBS) Pain with defecation: Positive for skin irritation. Straining with defecation: Negative Hemorrhoids: unsure, but feels they might exist  Toileting posture: heels elevated Incontinence: Negative.   SEXUAL HISTORY AND FUNCTION: Sexually active: Yes  Pain with penetration: deep thrusting, post-coital, and interrupts sexual activity Pain with external stimulation: No  Change in ability to achieve orgasm: Yes  Sexual abuse: No    OBJECTIVE:  DIAGNOSTIC TESTING/IMAGING: Normal cystoscopy   COGNITION:  Patient is oriented to person, place, and time.  Recent memory is intact.  Remote memory is intact.  Attention span and concentration are intact.  Expressive speech is intact.  Patient's fund of knowledge is within normal limits for educational level.    POSTURE/OBSERVATIONS:   Lumbar lordosis: WNL Thoracic kyphosis: WNL Iliac crest height: not formally assessed  Lumbar lateral shift:  not formally assessed  Pelvic obliquity: not formally assessed  Leg length discrepancy: not formally assessed    GAIT:  Grossly WFL; no apparent/significant aberrations noted.   04/13/2022 TREATMENT SUBJECTIVE: Patient notes no significant changes since evaluation. Patient did start using foot stool for BMs and has noted improvement with that.  PAIN: 1/10, lower abdomen  Pre-treatment assessment:  IC level and no noted rotation on assessment.  RANGE OF MOTION:   AROM (Normal range in  degrees) AROM  04/13/2022  Lumbar   Flexion (65) WNL  Extension (30) WNL  Right lateral flexion (25) WNL  Left lateral flexion (25) WNL  Right rotation (30) WNL  Left rotation (30) WNL      Hip LEFT RIGHT  Flexion (125) WNL WNL  Extension (15)    Abduction (40) WNL WNL  Adduction  WNL WNL  Internal Rotation (45) WNL WNL  External Rotation (45) WNL WNL  (* = pain; blank rows = not tested)  ABDOMINAL:   Palpation: no TTP Diastasis:3 finger separation below the umbilicus Scar mobility:  Rib flare: none noted   EXTERNAL PELVIC EXAM:  Patient educated on the purpose of the procedure/exam and articulated understanding and consented to the procedure/exam. and verbal  Breath coordination: present Voluntary Contraction: present (2-3/5MMT) Relaxation: non-relaxing Perineal movement with sustained IAP increase ("bear down"): descent Perineal movement with rapid IAP increase ("cough"): descent  Manual Therapy:   Neuromuscular Re-education: Supine hooklying diaphragmatic breathing with VCs and TCs for downregulation of the nervous system and improved management of IAP Supine hooklying, PFM lengthening with inhalation. VCs and TCs to decrease compensatory patterns and encourage optimal relaxation of the PFM. Supine hooklying, DRAM correction with exhalation. VCs and TCs to decrease compensatory patterns and minimize aggravation of the lumbar paraspinals.   Therapeutic Exercise:   Treatments unbilled:  Post-treatment assessment:  Patient educated throughout session on appropriate technique and form using multi-modal cueing, HEP, and activity modification. Patient articulated understanding and returned demonstration.  Patient Response to interventions:    PATIENT SURVEYS:  FOTO Urinary Problem 67; PFDI Pain 33; PFDI Urinary 17  ASSESSMENT:  Clinical Impression: Patient presents to clinic with excellent motivation to participate in therapy. Patient demonstrates deficits  in PFM coordination, PFM strength, pain, and IAP management. Patient able to achieve palpable PFM length with prolonged inhalation during today's session and responded positively to active interventions. Patient will benefit from continued skilled therapeutic intervention to address remaining deficits in PFM coordination, PFM strength, pain, and IAP management in order to increase function and improve overall QOL.     Objective impairments: decreased activity tolerance, decreased coordination, decreased endurance, decreased knowledge of condition, increased muscle spasms, improper body mechanics, and pain.   Activity limitations: interpersonal relationship, driving, shopping, and community activity.   Personal factors: Behavior pattern, Past/current experiences, Time since onset of injury/illness/exacerbation, and 3+ comorbidities: migraines, OSA, IBS, GERD, anxiety, depression, OCD  are also affecting patient's functional outcome.   Rehab Potential: Good  Clinical decision making: Evolving/moderate complexity  Evaluation complexity: Moderate   GOALS: Goals reviewed with patient? Yes  SHORT TERM GOALS: Target date: 05/18/2022  Patient will demonstrate independence with HEP in order to maximize therapeutic gains and improve carryover from physical therapy sessions to ADLs in the home and community. Baseline: not initiated Goal status: INITIAL  LONG TERM GOALS: Target date: 06/29/2022  Patient will demonstrate improved function as evidenced by a score of 73 on FOTO measure for full participation in activities at home and in the community.  Baseline: 61 Goal status: INITIAL  Patient will decrease worst pain as reported on NPRS by at least 2 points to demonstrate clinically significant reduction in pain in order to restore/improve function and overall QOL. Baseline: 9/10 Goal status: INITIAL  Patient will demonstrate coordinated lengthening and relaxation of PFM with diaphragmatic  inhalation in order to decrease spasm and allow for unrestricted elimination of urine/feces for improved overall QOL. Baseline: not formally assessed  Goal status: INITIAL   PLAN: Rehab frequency: 1x/week  Rehab duration: 12 weeks  Planned interventions: Therapeutic exercises, Therapeutic activity, Neuromuscular re-education, Balance training, Gait training, Patient/Family education, Self Care, Joint mobilization, Electrical stimulation, Spinal mobilization, Cryotherapy, Moist heat, scar mobilization, Taping, and Manual therapy     Myles Gip PT, DPT [phone]  04/13/2022, 11:16 AM

## 2022-04-15 DIAGNOSIS — F3341 Major depressive disorder, recurrent, in partial remission: Secondary | ICD-10-CM | POA: Insufficient documentation

## 2022-04-27 ENCOUNTER — Ambulatory Visit: Payer: Medicaid Other | Admitting: Physical Therapy

## 2022-05-04 ENCOUNTER — Encounter: Payer: Self-pay | Admitting: Physical Therapy

## 2022-05-04 ENCOUNTER — Ambulatory Visit: Payer: Medicaid Other | Admitting: Physical Therapy

## 2022-05-04 DIAGNOSIS — R278 Other lack of coordination: Secondary | ICD-10-CM

## 2022-05-04 DIAGNOSIS — R102 Pelvic and perineal pain: Secondary | ICD-10-CM

## 2022-05-04 DIAGNOSIS — M6281 Muscle weakness (generalized): Secondary | ICD-10-CM

## 2022-05-04 NOTE — Therapy (Signed)
OUTPATIENT PHYSICAL THERAPY FEMALE PELVIC TREATMENT   Patient Name: [name] [name] MRN: [id number] DOB:Jun 16, 1986, 36 y.o., female Today's Date: 05/04/2022   PT End of Session - 05/04/22 1207     Visit Number 3    Number of Visits 12    Date for PT Re-Evaluation 06/29/22    Authorization Type IE 04/06/2022    PT Start Time 1200    PT Stop Time 1240    PT Time Calculation (min) 40 min    Activity Tolerance Patient tolerated treatment well    Behavior During Therapy WFL for tasks assessed/performed             Past Medical History:  Diagnosis Date   Abdominal cramping    Asthma    as a child   Headache    migraines   Heart murmur    asd murmur. saw cardiologist until age 69 and it was so small. rechecked 6 years ago and all was fine   Past Surgical History:  Procedure Laterality Date   KNEE ARTHROSCOPY Right 2004   LAPAROSCOPIC VAGINAL HYSTERECTOMY WITH SALPINGECTOMY Bilateral 09/11/2017   Procedure: LAPAROSCOPIC ASSISTED VAGINAL HYSTERECTOMY WITH SALPINGECTOMY;  Surgeon: Schermerhorn, Ihor Austin, MD;  Location: [facility];  Service: Gynecology;  Laterality: Bilateral;   LAPAROSCOPY N/A 08/03/2018   Procedure: LAPAROSCOPY OPERATIVE,;  Surgeon: Suzy Bouchard, MD;  Location: [facility];  Service: Gynecology;  Laterality: N/A;   LYSIS OF ADHESION N/A 08/03/2018   Procedure: LYSIS OF ADHESION;  Surgeon: Schermerhorn, Ihor Austin, MD;  Location: [facility];  Service: Gynecology;  Laterality: N/A;   TONSILLECTOMY  age 64   Patient Active Problem List   Diagnosis Date Noted   Elevated serum creatinine 08/11/2021   Obsessive-compulsive disorder 08/11/2021   CPAP use counseling 11/30/2020   Gastroesophageal reflux disease without esophagitis 11/04/2020   Excessive daytime sleepiness 08/11/2020   Rebound headache 08/11/2020   Upper back pain, chronic 02/24/2020   Mild obstructive sleep apnea 12/31/2019   Atypical chest pain 12/30/2019   History of depression 09/05/2019    History of Lyme disease 09/05/2019   Seasonal allergic rhinitis 09/05/2019   Asthma without status asthmaticus 06/19/2018   Cardiac murmur 06/19/2018   Dyspareunia, female 06/19/2018   Exertional dyspnea 06/19/2018   Former tobacco use 06/19/2018   IBS (irritable bowel syndrome) 06/19/2018   Knee pain 06/19/2018   Pelvic pain in female 06/19/2018   Migraine 06/19/2018   Endometriosis of pelvis 09/27/2017   Postoperative state 09/11/2017   Radiculopathy of lumbar region 08/31/2016   Anxiety 12/05/2013   Vitamin B12 deficiency 12/05/2013   Depression 12/05/2013   Neck pain 12/05/2013   BMI 40.0-44.9, adult ([facility]) 12/05/2013    [facility]P: [facility]  REFERRING PROVIDER: Sondra Come, MD  REFERRING DIAG: M62.89 (ICD-10-CM) - Pelvic floor dysfunction in female   THERAPY DIAG:  Other lack of coordination  Pelvic pain  Muscle weakness (generalized)  Rationale for Evaluation and Treatment Rehabilitation  PRECAUTIONS: None  WEIGHT BEARING RESTRICTIONS No  FALLS:  Has patient fallen in last 6 months? No  ONSET DATE: 2018  Initial Evaluation: SUBJECTIVE:  CHIEF COMPLAINT: Patient notes that she has had longstanding abdominal pain 2/2 to endometriosis. Patient was referred because of consistent, monthly UTI symptoms that were not found to be related to bacterial. Patient was cleared from infection and from endometrial adhesions on the bladder. Patient notes symptoms include painful sex with deep penetration, increased urinary urgency and frequency after penetrative sex. Patient also deals with SUI.    PERTINENT HISTORY/CHART REVIEW:  Red flags (bowel/bladder changes, saddle paresthesia, personal history of cancer, h/o spinal tumors, h/o compression fx, h/o abdominal aneurysm,  abdominal pain, chills/fever, night sweats, nausea, vomiting, unrelenting pain, first onset of insidious LBP <20 y/o): Negative  02/2022 Urology Note: "36 year old female who reports typically 1 episode per month of a few days of severe urinary urgency.  Urine cultures have been negative or showed mixed flora, and she has had no improvement with antibiotics through PCP.  She denies any gross hematuria or dysuria, and the primary issue is urinary urgency.  She is taking cranberry pills over the last few months as well as a vaginal probiotic.  She drew primarily water during the day.  Her history is notable for endometriosis with history of vaginal hysterectomy, as well as a diagnostic laparoscopy with Dr. Ouida Sills in January 2020 for lysis of adhesions.  She also reports pain with sexual activity.   Urinalysis today with 11-20 squamous cells, but otherwise benign.  No recent imaging to review."   PAIN:  Are you having pain? Yes NPRS scale:  2/10 (current) suprapubic 2/10 (least) 9/10 (worst)- around cycle with cramping, with false UTIs Pain location: suprapubic (even with vaginal penetration) Pain type: constant Pain descriptors: stabbing Pain duration:   Aggravating factors: penetration, false UTIs Relieving factors: heating pad, fetal position  OCCUPATION/LEISURE ACTIVITIES:  Nail tech  PLOF:  Independent  PATIENT GOALS: "Less pain"  OBSTETRICAL HISTORY: G2P2 Deliveries: SVD Tearing/Episiotomy: none  GYNECOLOGICAL HISTORY: Hysterectomy: Yes Vaginal Endometriosis: Positive for history Last Menstrual Period:  Pain with exam: Yes  Prolapse: None Heaviness/pressure: No   UROLOGICAL HISTORY: Frequency of urination: every 2 hours; flare up every 30 min Incontinence: Coughing, Sneezing, and Laughing  Onset: 2013 Amount: Min Protective undergarments: No   Fluid Intake: ~ 50 oz H20, every other day coffee caffeinated, 1 cup juices- cranberry Nocturia:  1-2x/night Toileting posture: heels elevated Incomplete emptying: No  Pain with urination: Positive for suprapubic pain with flare up Stream: Strong; occasional spraying  Urgency: Yes (flare up) Difficulty initiating urination: Negative Intermittent stream: Negative Frequent UTI: Positive for suspicion, but urinalysis has been more often negative.   GASTROINTESTINAL HISTORY: Type of bowel movement: (Bristol Stool Scale) 6-7 Frequency of BMs: at least 3x/day Incomplete bowel movement: Yes (IBS) Pain with defecation: Positive for skin irritation. Straining with defecation: Negative Hemorrhoids: unsure, but feels they might exist  Toileting posture: heels elevated Incontinence: Negative.   SEXUAL HISTORY AND FUNCTION: Sexually active: Yes  Pain with penetration: deep thrusting, post-coital, and interrupts sexual activity Pain with external stimulation: No  Change in ability to achieve orgasm: Yes  Sexual abuse: No    OBJECTIVE:  DIAGNOSTIC TESTING/IMAGING: Normal cystoscopy   COGNITION:  Patient is oriented to person, place, and time.  Recent memory is intact.  Remote memory is intact.  Attention span and concentration are intact.  Expressive speech is intact.  Patient's fund of knowledge is within normal limits for educational level.    POSTURE/OBSERVATIONS:   Lumbar lordosis: WNL Thoracic kyphosis: WNL Iliac crest height: not formally assessed  Lumbar lateral shift:  not formally assessed  Pelvic obliquity: not formally assessed  Leg length discrepancy: not formally assessed    GAIT:  Grossly WFL; no apparent/significant aberrations noted.   04/13/2022 IC level and no noted rotation on assessment.  RANGE OF MOTION:   AROM (Normal range in degrees) AROM  04/13/2022  Lumbar   Flexion (65) WNL  Extension (30) WNL  Right lateral flexion (25) WNL  Left lateral flexion (25) WNL  Right rotation (30) WNL  Left rotation (30) WNL      Hip LEFT RIGHT  Flexion  (125) WNL WNL  Extension (15)    Abduction (40) WNL WNL  Adduction  WNL WNL  Internal Rotation (45) WNL WNL  External Rotation (45) WNL WNL  (* = pain; blank rows = not tested)  ABDOMINAL:   Palpation: no TTP Diastasis:3 finger separation below the umbilicus Scar mobility:  Rib flare: none noted   EXTERNAL PELVIC EXAM:  Patient educated on the purpose of the procedure/exam and articulated understanding and consented to the procedure/exam. and verbal  Breath coordination: present Voluntary Contraction: present (2-3/5MMT) Relaxation: non-relaxing Perineal movement with sustained IAP increase ("bear down"): descent Perineal movement with rapid IAP increase ("cough"): descent  05/04/2022 TREATMENT SUBJECTIVE: Patient notes that she had a stressful couple of weeks with a URI and her mother sustaining a concussion. Patient with increased coughing during the stressful period only had one bout of UI.  PAIN: 1/10, lower abdomen  Pre-treatment assessment:  Manual Therapy:   Neuromuscular Re-education: Patient education on nervous system down-regulation: lower lights, decrease sound/pace of music, decrease traveling speed.  Supine knee to chest with PFM lengthening, BLE, for improved PFM spasm release Supine double knee to chest with PFM lengthening for improved PFM spasm release Supine butterfly with PFM lengthening, BLE, for improved PFM tissue length Child's pose with PFM lengthening, for improved PFM tissue length   Therapeutic Exercise:   Treatments unbilled:  Post-treatment assessment:  Patient educated throughout session on appropriate technique and form using multi-modal cueing, HEP, and activity modification. Patient articulated understanding and returned demonstration.  Patient Response to interventions:    PATIENT SURVEYS:  FOTO Urinary Problem 67; PFDI Pain 33; PFDI Urinary 17  ASSESSMENT:  Clinical Impression: Patient presents to clinic with excellent  motivation to participate in therapy. Patient demonstrates deficits in PFM coordination, PFM strength, pain, and IAP management. Patient had excellent form with PFM stretches during today's session and responded positively to active and educational interventions. Patient will benefit from continued skilled therapeutic intervention to address remaining deficits in PFM coordination, PFM strength, pain, and IAP management in order to increase function and improve overall QOL.     Objective impairments: decreased activity tolerance, decreased coordination, decreased endurance, decreased knowledge of condition, increased muscle spasms, improper body mechanics, and pain.   Activity limitations: interpersonal relationship, driving, shopping, and community activity.   Personal factors: Behavior pattern, Past/current experiences, Time since onset of injury/illness/exacerbation, and 3+ comorbidities: migraines, OSA, IBS, GERD, anxiety, depression, OCD  are also affecting patient's functional outcome.   Rehab Potential: Good  Clinical decision making: Evolving/moderate complexity  Evaluation complexity: Moderate   GOALS: Goals reviewed with patient? Yes  SHORT TERM GOALS: Target date: 05/18/2022  Patient will demonstrate independence with HEP in order to maximize therapeutic gains and improve carryover from physical therapy sessions to ADLs in the home and community. Baseline: not initiated Goal status: INITIAL  LONG TERM GOALS: Target date: 06/29/2022  Patient will demonstrate improved function as evidenced by a score  of 73 on FOTO measure for full participation in activities at home and in the community.  Baseline: 61 Goal status: INITIAL  Patient will decrease worst pain as reported on NPRS by at least 2 points to demonstrate clinically significant reduction in pain in order to restore/improve function and overall QOL. Baseline: 9/10 Goal status: INITIAL  Patient will demonstrate  coordinated lengthening and relaxation of PFM with diaphragmatic inhalation in order to decrease spasm and allow for unrestricted elimination of urine/feces for improved overall QOL. Baseline: not formally assessed  Goal status: INITIAL   PLAN: Rehab frequency: 1x/week  Rehab duration: 12 weeks  Planned interventions: Therapeutic exercises, Therapeutic activity, Neuromuscular re-education, Balance training, Gait training, Patient/Family education, Self Care, Joint mobilization, Electrical stimulation, Spinal mobilization, Cryotherapy, Moist heat, scar mobilization, Taping, and Manual therapy     Myles Gip PT, DPT [phone]  05/04/2022, 12:08 PM

## 2022-05-11 ENCOUNTER — Ambulatory Visit: Payer: Medicaid Other | Attending: Urology | Admitting: Physical Therapy

## 2022-05-11 ENCOUNTER — Encounter: Payer: Self-pay | Admitting: Physical Therapy

## 2022-05-11 DIAGNOSIS — R102 Pelvic and perineal pain: Secondary | ICD-10-CM | POA: Diagnosis present

## 2022-05-11 DIAGNOSIS — M6281 Muscle weakness (generalized): Secondary | ICD-10-CM | POA: Insufficient documentation

## 2022-05-11 DIAGNOSIS — R278 Other lack of coordination: Secondary | ICD-10-CM | POA: Insufficient documentation

## 2022-05-11 NOTE — Therapy (Signed)
OUTPATIENT PHYSICAL THERAPY FEMALE PELVIC TREATMENT   Patient Name: [name] [name] MRN: [id number] DOB:10-03-1985, 36 y.o., female Today's Date: 05/11/2022   PT End of Session - 05/11/22 0955     Visit Number 4    Number of Visits 12    Date for PT Re-Evaluation 06/29/22    Authorization Type IE 04/06/2022    PT Start Time 0950    PT Stop Time 1035    PT Time Calculation (min) 45 min    Activity Tolerance Patient tolerated treatment well    Behavior During Therapy [facility] for tasks assessed/performed             Past Medical History:  Diagnosis Date   Abdominal cramping    Asthma    as a child   Headache    migraines   Heart murmur    asd murmur. saw cardiologist until age 17 and it was so small. rechecked 6 years ago and all was fine   Past Surgical History:  Procedure Laterality Date   KNEE ARTHROSCOPY Right 2004   LAPAROSCOPIC VAGINAL HYSTERECTOMY WITH SALPINGECTOMY Bilateral 09/11/2017   Procedure: LAPAROSCOPIC ASSISTED VAGINAL HYSTERECTOMY WITH SALPINGECTOMY;  Surgeon: Schermerhorn, Ihor Austin, MD;  Location: [facility];  Service: Gynecology;  Laterality: Bilateral;   LAPAROSCOPY N/A 08/03/2018   Procedure: LAPAROSCOPY OPERATIVE,;  Surgeon: Suzy Bouchard, MD;  Location: [facility];  Service: Gynecology;  Laterality: N/A;   LYSIS OF ADHESION N/A 08/03/2018   Procedure: LYSIS OF ADHESION;  Surgeon: Schermerhorn, Ihor Austin, MD;  Location: [facility];  Service: Gynecology;  Laterality: N/A;   TONSILLECTOMY  age 79   Patient Active Problem List   Diagnosis Date Noted   Elevated serum creatinine 08/11/2021   Obsessive-compulsive disorder 08/11/2021   CPAP use counseling 11/30/2020   Gastroesophageal reflux disease without esophagitis 11/04/2020   Excessive daytime sleepiness 08/11/2020   Rebound headache 08/11/2020   Upper back pain, chronic 02/24/2020   Mild obstructive sleep apnea 12/31/2019   Atypical chest pain 12/30/2019   History of depression 09/05/2019    History of Lyme disease 09/05/2019   Seasonal allergic rhinitis 09/05/2019   Asthma without status asthmaticus 06/19/2018   Cardiac murmur 06/19/2018   Dyspareunia, female 06/19/2018   Exertional dyspnea 06/19/2018   Former tobacco use 06/19/2018   IBS (irritable bowel syndrome) 06/19/2018   Knee pain 06/19/2018   Pelvic pain in female 06/19/2018   Migraine 06/19/2018   Endometriosis of pelvis 09/27/2017   Postoperative state 09/11/2017   Radiculopathy of lumbar region 08/31/2016   Anxiety 12/05/2013   Vitamin B12 deficiency 12/05/2013   Depression 12/05/2013   Neck pain 12/05/2013   BMI 40.0-44.9, adult ([facility]) 12/05/2013    [facility]P: [facility]  REFERRING PROVIDER: Sondra Come, MD  REFERRING DIAG: M62.89 (ICD-10-CM) - Pelvic floor dysfunction in female   THERAPY DIAG:  Other lack of coordination  Pelvic pain  Muscle weakness (generalized)  Rationale for Evaluation and Treatment Rehabilitation  PRECAUTIONS: None  WEIGHT BEARING RESTRICTIONS No  FALLS:  Has patient fallen in last 6 months? No  ONSET DATE: 2018  Initial Evaluation: SUBJECTIVE:  CHIEF COMPLAINT: Patient notes that she has had longstanding abdominal pain 2/2 to endometriosis. Patient was referred because of consistent, monthly UTI symptoms that were not found to be related to bacterial. Patient was cleared from infection and from endometrial adhesions on the bladder. Patient notes symptoms include painful sex with deep penetration, increased urinary urgency and frequency after penetrative sex. Patient also deals with SUI.    PERTINENT HISTORY/CHART REVIEW:  Red flags (bowel/bladder changes, saddle paresthesia, personal history of cancer, h/o spinal tumors, h/o compression fx, h/o abdominal aneurysm,  abdominal pain, chills/fever, night sweats, nausea, vomiting, unrelenting pain, first onset of insidious LBP <20 y/o): Negative  02/2022 Urology Note: "36 year old female who reports typically 1 episode per month of a few days of severe urinary urgency.  Urine cultures have been negative or showed mixed flora, and she has had no improvement with antibiotics through PCP.  She denies any gross hematuria or dysuria, and the primary issue is urinary urgency.  She is taking cranberry pills over the last few months as well as a vaginal probiotic.  She drew primarily water during the day.  Her history is notable for endometriosis with history of vaginal hysterectomy, as well as a diagnostic laparoscopy with Dr. Ouida Sills in January 2020 for lysis of adhesions.  She also reports pain with sexual activity.   Urinalysis today with 11-20 squamous cells, but otherwise benign.  No recent imaging to review."   PAIN:  Are you having pain? Yes NPRS scale:  2/10 (current) suprapubic 2/10 (least) 9/10 (worst)- around cycle with cramping, with false UTIs Pain location: suprapubic (even with vaginal penetration) Pain type: constant Pain descriptors: stabbing Pain duration:   Aggravating factors: penetration, false UTIs Relieving factors: heating pad, fetal position  OCCUPATION/LEISURE ACTIVITIES:  Nail tech  PLOF:  Independent  PATIENT GOALS: "Less pain"  OBSTETRICAL HISTORY: G2P2 Deliveries: SVD Tearing/Episiotomy: none  GYNECOLOGICAL HISTORY: Hysterectomy: Yes Vaginal Endometriosis: Positive for history Last Menstrual Period:  Pain with exam: Yes  Prolapse: None Heaviness/pressure: No   UROLOGICAL HISTORY: Frequency of urination: every 2 hours; flare up every 30 min Incontinence: Coughing, Sneezing, and Laughing  Onset: 2013 Amount: Min Protective undergarments: No   Fluid Intake: ~ 50 oz H20, every other day coffee caffeinated, 1 cup juices- cranberry Nocturia:  1-2x/night Toileting posture: heels elevated Incomplete emptying: No  Pain with urination: Positive for suprapubic pain with flare up Stream: Strong; occasional spraying  Urgency: Yes (flare up) Difficulty initiating urination: Negative Intermittent stream: Negative Frequent UTI: Positive for suspicion, but urinalysis has been more often negative.   GASTROINTESTINAL HISTORY: Type of bowel movement: (Bristol Stool Scale) 6-7 Frequency of BMs: at least 3x/day Incomplete bowel movement: Yes (IBS) Pain with defecation: Positive for skin irritation. Straining with defecation: Negative Hemorrhoids: unsure, but feels they might exist  Toileting posture: heels elevated Incontinence: Negative.   SEXUAL HISTORY AND FUNCTION: Sexually active: Yes  Pain with penetration: deep thrusting, post-coital, and interrupts sexual activity Pain with external stimulation: No  Change in ability to achieve orgasm: Yes  Sexual abuse: No    OBJECTIVE:  DIAGNOSTIC TESTING/IMAGING: Normal cystoscopy   COGNITION:  Patient is oriented to person, place, and time.  Recent memory is intact.  Remote memory is intact.  Attention span and concentration are intact.  Expressive speech is intact.  Patient's fund of knowledge is within normal limits for educational level.    POSTURE/OBSERVATIONS:   Lumbar lordosis: WNL Thoracic kyphosis: WNL Iliac crest height: not formally assessed  Lumbar lateral shift:  not formally assessed  Pelvic obliquity: not formally assessed  Leg length discrepancy: not formally assessed    GAIT:  Grossly WFL; no apparent/significant aberrations noted.   04/13/2022 IC level and no noted rotation on assessment.  RANGE OF MOTION:   AROM (Normal range in degrees) AROM  04/13/2022  Lumbar   Flexion (65) WNL  Extension (30) WNL  Right lateral flexion (25) WNL  Left lateral flexion (25) WNL  Right rotation (30) WNL  Left rotation (30) WNL      Hip LEFT RIGHT  Flexion  (125) WNL WNL  Extension (15)    Abduction (40) WNL WNL  Adduction  WNL WNL  Internal Rotation (45) WNL WNL  External Rotation (45) WNL WNL  (* = pain; blank rows = not tested)  ABDOMINAL:   Palpation: no TTP Diastasis:3 finger separation below the umbilicus Scar mobility:  Rib flare: none noted   EXTERNAL PELVIC EXAM:  Patient educated on the purpose of the procedure/exam and articulated understanding and consented to the procedure/exam. and verbal  Breath coordination: present Voluntary Contraction: present (2-3/5MMT) Relaxation: non-relaxing Perineal movement with sustained IAP increase ("bear down"): descent Perineal movement with rapid IAP increase ("cough"): descent  05/11/2022 TREATMENT SUBJECTIVE: Patient notes that she has made HEP part of sleep hygiene routine. Patient notes that she hasn't been hurting as much. Patient states that she did have some lower abdominal cramping but attributes this more to IBS.  PAIN: 0/10, lower abdomen  Pre-treatment assessment:  Manual Therapy: Colon massage with patient education on GI anatomy and typical function. Patient provided with handout for home practice.  Gentle lymphatic stimulation of the abdomen and inguinal region for improved pain management. Patient educated throughout on proper technique and provided with handout for home practice.  Neuromuscular Re-education:   Therapeutic Exercise:   Treatments unbilled:  Post-treatment assessment:  Patient educated throughout session on appropriate technique and form using multi-modal cueing, HEP, and activity modification. Patient articulated understanding and returned demonstration.  Patient Response to interventions:    PATIENT SURVEYS:  FOTO Urinary Problem 67; PFDI Pain 33; PFDI Urinary 17  ASSESSMENT:  Clinical Impression: Patient presents to clinic with excellent motivation to participate in therapy. Patient demonstrates deficits in PFM coordination, PFM  strength, pain, and IAP management. Patient participated in manual based session and was receptive to learning techniques for home management of abdominal pain during today's session and responded positively to both bowel massage and lymphatic stimulation. Patient will benefit from continued skilled therapeutic intervention to address remaining deficits in PFM coordination, PFM strength, pain, and IAP management in order to increase function and improve overall QOL.     Objective impairments: decreased activity tolerance, decreased coordination, decreased endurance, decreased knowledge of condition, increased muscle spasms, improper body mechanics, and pain.   Activity limitations: interpersonal relationship, driving, shopping, and community activity.   Personal factors: Behavior pattern, Past/current experiences, Time since onset of injury/illness/exacerbation, and 3+ comorbidities: migraines, OSA, IBS, GERD, anxiety, depression, OCD  are also affecting patient's functional outcome.   Rehab Potential: Good  Clinical decision making: Evolving/moderate complexity  Evaluation complexity: Moderate   GOALS: Goals reviewed with patient? Yes  SHORT TERM GOALS: Target date: 05/18/2022  Patient will demonstrate independence with HEP in order to maximize therapeutic gains and improve carryover from physical therapy sessions to ADLs in the home and community. Baseline: not initiated Goal status: INITIAL  LONG TERM GOALS: Target date: 06/29/2022  Patient will demonstrate improved function as evidenced by a score of 73  on FOTO measure for full participation in activities at home and in the community.  Baseline: 61 Goal status: INITIAL  Patient will decrease worst pain as reported on NPRS by at least 2 points to demonstrate clinically significant reduction in pain in order to restore/improve function and overall QOL. Baseline: 9/10 Goal status: INITIAL  Patient will demonstrate coordinated  lengthening and relaxation of PFM with diaphragmatic inhalation in order to decrease spasm and allow for unrestricted elimination of urine/feces for improved overall QOL. Baseline: not formally assessed  Goal status: INITIAL   PLAN: Rehab frequency: 1x/week  Rehab duration: 12 weeks  Planned interventions: Therapeutic exercises, Therapeutic activity, Neuromuscular re-education, Balance training, Gait training, Patient/Family education, Self Care, Joint mobilization, Electrical stimulation, Spinal mobilization, Cryotherapy, Moist heat, scar mobilization, Taping, and Manual therapy     Myles Gip PT, DPT [phone]  05/11/2022, 9:56 AM

## 2022-05-18 ENCOUNTER — Ambulatory Visit: Payer: Medicaid Other | Admitting: Physical Therapy

## 2022-05-26 ENCOUNTER — Encounter: Payer: Self-pay | Admitting: Physical Therapy

## 2022-05-26 ENCOUNTER — Ambulatory Visit: Payer: Medicaid Other | Admitting: Physical Therapy

## 2022-05-26 DIAGNOSIS — R278 Other lack of coordination: Secondary | ICD-10-CM | POA: Diagnosis not present

## 2022-05-26 DIAGNOSIS — R102 Pelvic and perineal pain: Secondary | ICD-10-CM

## 2022-05-26 DIAGNOSIS — M6281 Muscle weakness (generalized): Secondary | ICD-10-CM

## 2022-05-26 NOTE — Therapy (Signed)
OUTPATIENT PHYSICAL THERAPY FEMALE PELVIC TREATMENT   Patient Name: Oneida Mckamey MRN: [id number] DOB:December 27, 1985, 36 y.o., female Today's Date: 05/26/2022   PT End of Session - 05/26/22 0950     Visit Number 5    Number of Visits 12    Date for PT Re-Evaluation 06/29/22    Authorization Type IE 04/06/2022    PT Start Time 0950    PT Stop Time 1030    PT Time Calculation (min) 40 min    Activity Tolerance Patient tolerated treatment well    Behavior During Therapy WFL for tasks assessed/performed             Past Medical History:  Diagnosis Date   Abdominal cramping    Asthma    as a child   Headache    migraines   Heart murmur    asd murmur. saw cardiologist until age 62 and it was so small. rechecked 6 years ago and all was fine   Past Surgical History:  Procedure Laterality Date   KNEE ARTHROSCOPY Right 2004   LAPAROSCOPIC VAGINAL HYSTERECTOMY WITH SALPINGECTOMY Bilateral 09/11/2017   Procedure: LAPAROSCOPIC ASSISTED VAGINAL HYSTERECTOMY WITH SALPINGECTOMY;  Surgeon: Schermerhorn, Gwen Her, MD;  Location: [facility];  Service: Gynecology;  Laterality: Bilateral;   LAPAROSCOPY N/A 08/03/2018   Procedure: LAPAROSCOPY OPERATIVE,;  Surgeon: Boykin Nearing, MD;  Location: [facility];  Service: Gynecology;  Laterality: N/A;   LYSIS OF ADHESION N/A 08/03/2018   Procedure: LYSIS OF ADHESION;  Surgeon: Schermerhorn, Gwen Her, MD;  Location: [facility];  Service: Gynecology;  Laterality: N/A;   TONSILLECTOMY  age 45   Patient Active Problem List   Diagnosis Date Noted   Elevated serum creatinine 08/11/2021   Obsessive-compulsive disorder 08/11/2021   CPAP use counseling 11/30/2020   Gastroesophageal reflux disease without esophagitis 11/04/2020   Excessive daytime sleepiness 08/11/2020   Rebound headache 08/11/2020   Upper back pain, chronic 02/24/2020   Mild obstructive sleep apnea 12/31/2019   Atypical chest pain 12/30/2019   History of depression 09/05/2019    History of Lyme disease 09/05/2019   Seasonal allergic rhinitis 09/05/2019   Asthma without status asthmaticus 06/19/2018   Cardiac murmur 06/19/2018   Dyspareunia, female 06/19/2018   Exertional dyspnea 06/19/2018   Former tobacco use 06/19/2018   IBS (irritable bowel syndrome) 06/19/2018   Knee pain 06/19/2018   Pelvic pain in female 06/19/2018   Migraine 06/19/2018   Endometriosis of pelvis 09/27/2017   Postoperative state 09/11/2017   Radiculopathy of lumbar region 08/31/2016   Anxiety 12/05/2013   Vitamin B12 deficiency 12/05/2013   Depression 12/05/2013   Neck pain 12/05/2013   BMI 40.0-44.9, adult ([place]) 12/05/2013    PCP: [facility]  REFERRING PROVIDER: Billey Co, MD  REFERRING DIAG: M62.89 (ICD-10-CM) - Pelvic floor dysfunction in female   THERAPY DIAG:  Other lack of coordination  Muscle weakness (generalized)  Pelvic pain  Rationale for Evaluation and Treatment Rehabilitation  PRECAUTIONS: None  WEIGHT BEARING RESTRICTIONS No  FALLS:  Has patient fallen in last 6 months? No  ONSET DATE: 2018  Initial Evaluation: SUBJECTIVE:  CHIEF COMPLAINT: Patient notes that she has had longstanding abdominal pain 2/2 to endometriosis. Patient was referred because of consistent, monthly UTI symptoms that were not found to be related to bacterial. Patient was cleared from infection and from endometrial adhesions on the bladder. Patient notes symptoms include painful sex with deep penetration, increased urinary urgency and frequency after penetrative sex. Patient also deals with SUI.    PERTINENT HISTORY/CHART REVIEW:  Red flags (bowel/bladder changes, saddle paresthesia, personal history of cancer, h/o spinal tumors, h/o compression fx, h/o abdominal aneurysm,  abdominal pain, chills/fever, night sweats, nausea, vomiting, unrelenting pain, first onset of insidious LBP <20 y/o): Negative  02/2022 Urology Note: "36 year old female who reports typically 1 episode per month of a few days of severe urinary urgency.  Urine cultures have been negative or showed mixed flora, and she has had no improvement with antibiotics through PCP.  She denies any gross hematuria or dysuria, and the primary issue is urinary urgency.  She is taking cranberry pills over the last few months as well as a vaginal probiotic.  She drew primarily water during the day.  Her history is notable for endometriosis with history of vaginal hysterectomy, as well as a diagnostic laparoscopy with Dr. Ouida Sills in January 2020 for lysis of adhesions.  She also reports pain with sexual activity.   Urinalysis today with 11-20 squamous cells, but otherwise benign.  No recent imaging to review."   PAIN:  Are you having pain? Yes NPRS scale:  2/10 (current) suprapubic 2/10 (least) 9/10 (worst)- around cycle with cramping, with false UTIs Pain location: suprapubic (even with vaginal penetration) Pain type: constant Pain descriptors: stabbing Pain duration:   Aggravating factors: penetration, false UTIs Relieving factors: heating pad, fetal position  OCCUPATION/LEISURE ACTIVITIES:  Nail tech  PLOF:  Independent  PATIENT GOALS: "Less pain"  OBSTETRICAL HISTORY: G2P2 Deliveries: SVD Tearing/Episiotomy: none  GYNECOLOGICAL HISTORY: Hysterectomy: Yes Vaginal Endometriosis: Positive for history Last Menstrual Period:  Pain with exam: Yes  Prolapse: None Heaviness/pressure: No   UROLOGICAL HISTORY: Frequency of urination: every 2 hours; flare up every 30 min Incontinence: Coughing, Sneezing, and Laughing  Onset: 2013 Amount: Min Protective undergarments: No   Fluid Intake: ~ 50 oz H20, every other day coffee caffeinated, 1 cup juices- cranberry Nocturia:  1-2x/night Toileting posture: heels elevated Incomplete emptying: No  Pain with urination: Positive for suprapubic pain with flare up Stream: Strong; occasional spraying  Urgency: Yes (flare up) Difficulty initiating urination: Negative Intermittent stream: Negative Frequent UTI: Positive for suspicion, but urinalysis has been more often negative.   GASTROINTESTINAL HISTORY: Type of bowel movement: (Bristol Stool Scale) 6-7 Frequency of BMs: at least 3x/day Incomplete bowel movement: Yes (IBS) Pain with defecation: Positive for skin irritation. Straining with defecation: Negative Hemorrhoids: unsure, but feels they might exist  Toileting posture: heels elevated Incontinence: Negative.   SEXUAL HISTORY AND FUNCTION: Sexually active: Yes  Pain with penetration: deep thrusting, post-coital, and interrupts sexual activity Pain with external stimulation: No  Change in ability to achieve orgasm: Yes  Sexual abuse: No    OBJECTIVE:  DIAGNOSTIC TESTING/IMAGING: Normal cystoscopy   COGNITION:  Patient is oriented to person, place, and time.  Recent memory is intact.  Remote memory is intact.  Attention span and concentration are intact.  Expressive speech is intact.  Patient's fund of knowledge is within normal limits for educational level.    POSTURE/OBSERVATIONS:   Lumbar lordosis: WNL Thoracic kyphosis: WNL Iliac crest height: not formally assessed  Lumbar lateral shift:  not formally assessed  Pelvic obliquity: not formally assessed  Leg length discrepancy: not formally assessed    GAIT:  Grossly WFL; no apparent/significant aberrations noted.   04/13/2022 IC level and no noted rotation on assessment.  RANGE OF MOTION:   AROM (Normal range in degrees) AROM  04/13/2022  Lumbar   Flexion (65) WNL  Extension (30) WNL  Right lateral flexion (25) WNL  Left lateral flexion (25) WNL  Right rotation (30) WNL  Left rotation (30) WNL      Hip LEFT RIGHT  Flexion  (125) WNL WNL  Extension (15)    Abduction (40) WNL WNL  Adduction  WNL WNL  Internal Rotation (45) WNL WNL  External Rotation (45) WNL WNL  (* = pain; blank rows = not tested)  ABDOMINAL:   Palpation: no TTP Diastasis:3 finger separation below the umbilicus Scar mobility:  Rib flare: none noted   EXTERNAL PELVIC EXAM:  Patient educated on the purpose of the procedure/exam and articulated understanding and consented to the procedure/exam. and verbal  Breath coordination: present Voluntary Contraction: present (2-3/5MMT) Relaxation: non-relaxing Perineal movement with sustained IAP increase ("bear down"): descent Perineal movement with rapid IAP increase ("cough"): descent  05/26/2022 TREATMENT SUBJECTIVE: Patient reports she has been doing well, but did discover that when jumping at a trampoline park. Patient notes that she experienced leakage with impact mostly. Does feel like symptoms are improving overall, but does feel like they are still disruptive and can improve further.  PAIN: 0/10, lower abdomen  Pre-treatment assessment:  Manual Therapy:   Neuromuscular Re-education: Reassessed goals; see below.  Supine hooklying diaphragmatic breathing with VCs and TCs for downregulation of the nervous system and improved management of IAP Supine hooklying, PFM lengthening with 30% effort bearing down for improved length. VCs and TCs to decrease compensatory patterns and encourage optimal length of the PFM.   Therapeutic Exercise:   Treatments unbilled:  Post-treatment assessment:  Patient educated throughout session on appropriate technique and form using multi-modal cueing, HEP, and activity modification. Patient articulated understanding and returned demonstration.  Patient Response to interventions:    PATIENT SURVEYS:  FOTO Urinary Problem 67; PFDI Pain 33; PFDI Urinary 17  ASSESSMENT:  Clinical Impression: Patient presents to clinic with excellent  motivation to participate in therapy. Patient demonstrates deficits in PFM coordination, PFM strength, pain, and IAP management. Patient indicating gradual progress toward goal achievement during today's session and responded positively to PFM downtraining technique. Patient will benefit from continued skilled therapeutic intervention to address remaining deficits in PFM coordination, PFM strength, pain, and IAP management in order to increase function and improve overall QOL.     Objective impairments: decreased activity tolerance, decreased coordination, decreased endurance, decreased knowledge of condition, increased muscle spasms, improper body mechanics, and pain.   Activity limitations: interpersonal relationship, driving, shopping, and community activity.   Personal factors: Behavior pattern, Past/current experiences, Time since onset of injury/illness/exacerbation, and 3+ comorbidities: migraines, OSA, IBS, GERD, anxiety, depression, OCD  are also affecting patient's functional outcome.   Rehab Potential: Good  Clinical decision making: Evolving/moderate complexity  Evaluation complexity: Moderate   GOALS: Goals reviewed with patient? Yes  SHORT TERM GOALS: Target date: 05/18/2022  Patient will demonstrate independence with HEP in order to maximize therapeutic gains and improve carryover from physical therapy sessions to ADLs in the home and community. Baseline: not initiated; 11/16: IND Goal status: MET  LONG TERM GOALS: Target date: 06/29/2022  Patient will demonstrate improved function as evidenced by a score  of 73 on FOTO measure for full participation in activities at home and in the community.  Baseline: 61; 11/16: 64 Goal status: IN PROGRESS  Patient will decrease worst pain as reported on NPRS by at least 2 points to demonstrate clinically significant reduction in pain in order to restore/improve function and overall QOL. Baseline: 9/10; 11/16: 4-5/10 Goal status: IN  PROGRESS  Patient will demonstrate coordinated lengthening and relaxation of PFM with diaphragmatic inhalation in order to decrease spasm and allow for unrestricted elimination of urine/feces for improved overall QOL. Baseline: not formally assessed; 11/16: IND Goal status: IN PROGRESS   PLAN: Rehab frequency: 1x/week  Rehab duration: 12 weeks  Planned interventions: Therapeutic exercises, Therapeutic activity, Neuromuscular re-education, Balance training, Gait training, Patient/Family education, Self Care, Joint mobilization, Electrical stimulation, Spinal mobilization, Cryotherapy, Moist heat, scar mobilization, Taping, and Manual therapy     Myles Gip PT, DPT [phone]  05/26/2022, 9:51 AM

## 2022-06-07 ENCOUNTER — Encounter: Payer: Self-pay | Admitting: Physical Therapy

## 2022-06-14 ENCOUNTER — Encounter: Payer: Self-pay | Admitting: Physical Therapy

## 2022-06-14 ENCOUNTER — Ambulatory Visit: Payer: Medicaid Other | Attending: Urology | Admitting: Physical Therapy

## 2022-06-14 DIAGNOSIS — M6281 Muscle weakness (generalized): Secondary | ICD-10-CM | POA: Insufficient documentation

## 2022-06-14 DIAGNOSIS — R102 Pelvic and perineal pain: Secondary | ICD-10-CM | POA: Insufficient documentation

## 2022-06-14 DIAGNOSIS — R278 Other lack of coordination: Secondary | ICD-10-CM | POA: Diagnosis present

## 2022-06-14 NOTE — Therapy (Signed)
OUTPATIENT PHYSICAL THERAPY FEMALE PELVIC TREATMENT   Patient Name: Nandi Tonnesen MRN: [id number] DOB:[birth year], 36 y.o., female Today's Date: 06/14/2022   PT End of Session - 06/14/22 0857     Visit Number 6    Number of Visits 12    Date for PT Re-Evaluation 06/29/22    Authorization Type IE 04/06/2022    PT Start Time 0900    PT Stop Time 0940    PT Time Calculation (min) 40 min    Activity Tolerance Patient tolerated treatment well    Behavior During Therapy WFL for tasks assessed/performed             Past Medical History:  Diagnosis Date   Abdominal cramping    Asthma    as a child   Headache    migraines   Heart murmur    asd murmur. saw cardiologist until age 59 and it was so small. rechecked 6 years ago and all was fine   Past Surgical History:  Procedure Laterality Date   KNEE ARTHROSCOPY Right 2004   LAPAROSCOPIC VAGINAL HYSTERECTOMY WITH SALPINGECTOMY Bilateral 09/11/2017   Procedure: LAPAROSCOPIC ASSISTED VAGINAL HYSTERECTOMY WITH SALPINGECTOMY;  Surgeon: Schermerhorn, Gwen Her, MD;  Location: [facility];  Service: Gynecology;  Laterality: Bilateral;   LAPAROSCOPY N/A 08/03/2018   Procedure: LAPAROSCOPY OPERATIVE,;  Surgeon: Boykin Nearing, MD;  Location: [facility];  Service: Gynecology;  Laterality: N/A;   LYSIS OF ADHESION N/A 08/03/2018   Procedure: LYSIS OF ADHESION;  Surgeon: Schermerhorn, Gwen Her, MD;  Location: [facility];  Service: Gynecology;  Laterality: N/A;   TONSILLECTOMY  age 60   Patient Active Problem List   Diagnosis Date Noted   Elevated serum creatinine 08/11/2021   Obsessive-compulsive disorder 08/11/2021   CPAP use counseling 11/30/2020   Gastroesophageal reflux disease without esophagitis 11/04/2020   Excessive daytime sleepiness 08/11/2020   Rebound headache 08/11/2020   Upper back pain, chronic 02/24/2020   Mild obstructive sleep apnea 12/31/2019   Atypical chest pain 12/30/2019   History of depression 09/05/2019    History of Lyme disease 09/05/2019   Seasonal allergic rhinitis 09/05/2019   Asthma without status asthmaticus 06/19/2018   Cardiac murmur 06/19/2018   Dyspareunia, female 06/19/2018   Exertional dyspnea 06/19/2018   Former tobacco use 06/19/2018   IBS (irritable bowel syndrome) 06/19/2018   Knee pain 06/19/2018   Pelvic pain in female 06/19/2018   Migraine 06/19/2018   Endometriosis of pelvis 09/27/2017   Postoperative state 09/11/2017   Radiculopathy of lumbar region 08/31/2016   Anxiety 12/05/2013   Vitamin B12 deficiency 12/05/2013   Depression 12/05/2013   Neck pain 12/05/2013   BMI 40.0-44.9, adult ([place]) 12/05/2013    PCP: [facility]  REFERRING PROVIDER: Billey Co, MD  REFERRING DIAG: M62.89 (ICD-10-CM) - Pelvic floor dysfunction in female   THERAPY DIAG:  Other lack of coordination  Muscle weakness (generalized)  Pelvic pain  Rationale for Evaluation and Treatment Rehabilitation  PRECAUTIONS: None  WEIGHT BEARING RESTRICTIONS No  FALLS:  Has patient fallen in last 6 months? No  ONSET DATE: 2018  Initial Evaluation: SUBJECTIVE:  CHIEF COMPLAINT: Patient notes that she has had longstanding abdominal pain 2/2 to endometriosis. Patient was referred because of consistent, monthly UTI symptoms that were not found to be related to bacterial. Patient was cleared from infection and from endometrial adhesions on the bladder. Patient notes symptoms include painful sex with deep penetration, increased urinary urgency and frequency after penetrative sex. Patient also deals with SUI.    PERTINENT HISTORY/CHART REVIEW:  Red flags (bowel/bladder changes, saddle paresthesia, personal history of cancer, h/o spinal tumors, h/o compression fx, h/o abdominal aneurysm,  abdominal pain, chills/fever, night sweats, nausea, vomiting, unrelenting pain, first onset of insidious LBP <20 y/o): Negative  02/2022 Urology Note: "36 year old female who reports typically 1 episode per month of a few days of severe urinary urgency.  Urine cultures have been negative or showed mixed flora, and she has had no improvement with antibiotics through PCP.  She denies any gross hematuria or dysuria, and the primary issue is urinary urgency.  She is taking cranberry pills over the last few months as well as a vaginal probiotic.  She drew primarily water during the day.  Her history is notable for endometriosis with history of vaginal hysterectomy, as well as a diagnostic laparoscopy with Dr. Ouida Sills in January 2020 for lysis of adhesions.  She also reports pain with sexual activity.   Urinalysis today with 11-20 squamous cells, but otherwise benign.  No recent imaging to review."   PAIN:  Are you having pain? Yes NPRS scale:  2/10 (current) suprapubic 2/10 (least) 9/10 (worst)- around cycle with cramping, with false UTIs Pain location: suprapubic (even with vaginal penetration) Pain type: constant Pain descriptors: stabbing Pain duration:   Aggravating factors: penetration, false UTIs Relieving factors: heating pad, fetal position  OCCUPATION/LEISURE ACTIVITIES:  Nail tech  PLOF:  Independent  PATIENT GOALS: "Less pain"  OBSTETRICAL HISTORY: G2P2 Deliveries: SVD Tearing/Episiotomy: none  GYNECOLOGICAL HISTORY: Hysterectomy: Yes Vaginal Endometriosis: Positive for history Last Menstrual Period:  Pain with exam: Yes  Prolapse: None Heaviness/pressure: No   UROLOGICAL HISTORY: Frequency of urination: every 2 hours; flare up every 30 min Incontinence: Coughing, Sneezing, and Laughing  Onset: 2013 Amount: Min Protective undergarments: No   Fluid Intake: ~ 50 oz H20, every other day coffee caffeinated, 1 cup juices- cranberry Nocturia:  1-2x/night Toileting posture: heels elevated Incomplete emptying: No  Pain with urination: Positive for suprapubic pain with flare up Stream: Strong; occasional spraying  Urgency: Yes (flare up) Difficulty initiating urination: Negative Intermittent stream: Negative Frequent UTI: Positive for suspicion, but urinalysis has been more often negative.   GASTROINTESTINAL HISTORY: Type of bowel movement: (Bristol Stool Scale) 6-7 Frequency of BMs: at least 3x/day Incomplete bowel movement: Yes (IBS) Pain with defecation: Positive for skin irritation. Straining with defecation: Negative Hemorrhoids: unsure, but feels they might exist  Toileting posture: heels elevated Incontinence: Negative.   SEXUAL HISTORY AND FUNCTION: Sexually active: Yes  Pain with penetration: deep thrusting, post-coital, and interrupts sexual activity Pain with external stimulation: No  Change in ability to achieve orgasm: Yes  Sexual abuse: No    OBJECTIVE:  DIAGNOSTIC TESTING/IMAGING: Normal cystoscopy   COGNITION:  Patient is oriented to person, place, and time.  Recent memory is intact.  Remote memory is intact.  Attention span and concentration are intact.  Expressive speech is intact.  Patient's fund of knowledge is within normal limits for educational level.    POSTURE/OBSERVATIONS:   Lumbar lordosis: WNL Thoracic kyphosis: WNL Iliac crest height: not formally assessed  Lumbar lateral shift:  not formally assessed  Pelvic obliquity: not formally assessed  Leg length discrepancy: not formally assessed    GAIT:  Grossly WFL; no apparent/significant aberrations noted.   04/13/2022 IC level and no noted rotation on assessment.  RANGE OF MOTION:   AROM (Normal range in degrees) AROM  04/13/2022  Lumbar   Flexion (65) WNL  Extension (30) WNL  Right lateral flexion (25) WNL  Left lateral flexion (25) WNL  Right rotation (30) WNL  Left rotation (30) WNL      Hip LEFT RIGHT  Flexion  (125) WNL WNL  Extension (15)    Abduction (40) WNL WNL  Adduction  WNL WNL  Internal Rotation (45) WNL WNL  External Rotation (45) WNL WNL  (* = pain; blank rows = not tested)  ABDOMINAL:   Palpation: no TTP Diastasis:3 finger separation below the umbilicus Scar mobility:  Rib flare: none noted   EXTERNAL PELVIC EXAM:  Patient educated on the purpose of the procedure/exam and articulated understanding and consented to the procedure/exam. and verbal  Breath coordination: present Voluntary Contraction: present (2-3/5MMT) Relaxation: non-relaxing Perineal movement with sustained IAP increase ("bear down"): descent Perineal movement with rapid IAP increase ("cough"): descent  06/14/2022 TREATMENT SUBJECTIVE: Patient notes that she is feeling better since recent viral illness. Patient does note with hiatus due to illness she needs a review of HEP.  PAIN: 0/10, lower abdomen  Pre-treatment assessment:  Manual Therapy:   Neuromuscular Re-education: Supine knee to chest with PFM lengthening, BLE, for improved PFM spasm release Supine double knee to chest with PFM lengthening for improved PFM spasm release Supine butterfly with PFM lengthening, BLE, for improved PFM tissue length  Supine figure 4 with PFM lengthening, BLE for improved PFM relaxation Patient education on utilizing diaphragmatic breathing in any posture to downtrain PFM.   Therapeutic Exercise:   Treatments unbilled:  Post-treatment assessment:  Patient educated throughout session on appropriate technique and form using multi-modal cueing, HEP, and activity modification. Patient articulated understanding and returned demonstration.  Patient Response to interventions:    PATIENT SURVEYS:  FOTO Urinary Problem 67; PFDI Pain 33; PFDI Urinary 17  ASSESSMENT:  Clinical Impression: Patient presents to clinic with excellent motivation to participate in therapy. Patient demonstrates deficits in PFM  coordination, PFM strength, pain, and IAP management. Patient with good form and articulating clear understanding of PFM mechanics principles during today's session and responded positively to active interventions. Patient will benefit from continued skilled therapeutic intervention to address remaining deficits in PFM coordination, PFM strength, pain, and IAP management in order to increase function and improve overall QOL.     Objective impairments: decreased activity tolerance, decreased coordination, decreased endurance, decreased knowledge of condition, increased muscle spasms, improper body mechanics, and pain.   Activity limitations: interpersonal relationship, driving, shopping, and community activity.   Personal factors: Behavior pattern, Past/current experiences, Time since onset of injury/illness/exacerbation, and 3+ comorbidities: migraines, OSA, IBS, GERD, anxiety, depression, OCD  are also affecting patient's functional outcome.   Rehab Potential: Good  Clinical decision making: Evolving/moderate complexity  Evaluation complexity: Moderate   GOALS: Goals reviewed with patient? Yes  SHORT TERM GOALS: Target date: 05/18/2022  Patient will demonstrate independence with HEP in order to maximize therapeutic gains and improve carryover from physical therapy sessions to ADLs in the home and community. Baseline: not initiated; 11/16: IND Goal status: MET  LONG TERM GOALS: Target date: 06/29/2022  Patient will demonstrate improved function as evidenced by a score of 73 on FOTO measure  for full participation in activities at home and in the community.  Baseline: 61; 11/16: 64 Goal status: IN PROGRESS  Patient will decrease worst pain as reported on NPRS by at least 2 points to demonstrate clinically significant reduction in pain in order to restore/improve function and overall QOL. Baseline: 9/10; 11/16: 4-5/10 Goal status: IN PROGRESS  Patient will demonstrate coordinated  lengthening and relaxation of PFM with diaphragmatic inhalation in order to decrease spasm and allow for unrestricted elimination of urine/feces for improved overall QOL. Baseline: not formally assessed; 11/16: IND Goal status: IN PROGRESS   PLAN: Rehab frequency: 1x/week  Rehab duration: 12 weeks  Planned interventions: Therapeutic exercises, Therapeutic activity, Neuromuscular re-education, Balance training, Gait training, Patient/Family education, Self Care, Joint mobilization, Electrical stimulation, Spinal mobilization, Cryotherapy, Moist heat, scar mobilization, Taping, and Manual therapy     Myles Gip PT, DPT [phone]  06/14/2022, 8:58 AM

## 2022-06-22 ENCOUNTER — Ambulatory Visit: Payer: Medicaid Other | Admitting: Physical Therapy

## 2022-06-22 ENCOUNTER — Encounter: Payer: Self-pay | Admitting: Physical Therapy

## 2022-06-22 DIAGNOSIS — M6281 Muscle weakness (generalized): Secondary | ICD-10-CM

## 2022-06-22 DIAGNOSIS — R278 Other lack of coordination: Secondary | ICD-10-CM

## 2022-06-22 DIAGNOSIS — R102 Pelvic and perineal pain: Secondary | ICD-10-CM

## 2022-06-22 NOTE — Therapy (Signed)
OUTPATIENT PHYSICAL THERAPY FEMALE PELVIC TREATMENT   Patient Name: [name] [name] MRN: [id number] DOB:01/10/1986, 36 y.o., female Today's Date: 06/22/2022   PT End of Session - 06/22/22 0901     Visit Number 7    Number of Visits 12    Date for PT Re-Evaluation 06/29/22    Authorization Type IE 04/06/2022    PT Start Time 0900    PT Stop Time 0940    PT Time Calculation (min) 40 min    Activity Tolerance Patient tolerated treatment well    Behavior During Therapy WFL for tasks assessed/performed             Past Medical History:  Diagnosis Date   Abdominal cramping    Asthma    as a child   Headache    migraines   Heart murmur    asd murmur. saw cardiologist until age 5 and it was so small. rechecked 6 years ago and all was fine   Past Surgical History:  Procedure Laterality Date   KNEE ARTHROSCOPY Right 2004   LAPAROSCOPIC VAGINAL HYSTERECTOMY WITH SALPINGECTOMY Bilateral 09/11/2017   Procedure: LAPAROSCOPIC ASSISTED VAGINAL HYSTERECTOMY WITH SALPINGECTOMY;  Surgeon: Schermerhorn, Gwen Her, MD;  Location: [facility];  Service: Gynecology;  Laterality: Bilateral;   LAPAROSCOPY N/A 08/03/2018   Procedure: LAPAROSCOPY OPERATIVE,;  Surgeon: Boykin Nearing, MD;  Location: [facility];  Service: Gynecology;  Laterality: N/A;   LYSIS OF ADHESION N/A 08/03/2018   Procedure: LYSIS OF ADHESION;  Surgeon: Schermerhorn, Gwen Her, MD;  Location: [facility];  Service: Gynecology;  Laterality: N/A;   TONSILLECTOMY  age 59   Patient Active Problem List   Diagnosis Date Noted   Elevated serum creatinine 08/11/2021   Obsessive-compulsive disorder 08/11/2021   CPAP use counseling 11/30/2020   Gastroesophageal reflux disease without esophagitis 11/04/2020   Excessive daytime sleepiness 08/11/2020   Rebound headache 08/11/2020   Upper back pain, chronic 02/24/2020   Mild obstructive sleep apnea 12/31/2019   Atypical chest pain 12/30/2019   History of depression 09/05/2019    History of Lyme disease 09/05/2019   Seasonal allergic rhinitis 09/05/2019   Asthma without status asthmaticus 06/19/2018   Cardiac murmur 06/19/2018   Dyspareunia, female 06/19/2018   Exertional dyspnea 06/19/2018   Former tobacco use 06/19/2018   IBS (irritable bowel syndrome) 06/19/2018   Knee pain 06/19/2018   Pelvic pain in female 06/19/2018   Migraine 06/19/2018   Endometriosis of pelvis 09/27/2017   Postoperative state 09/11/2017   Radiculopathy of lumbar region 08/31/2016   Anxiety 12/05/2013   Vitamin B12 deficiency 12/05/2013   Depression 12/05/2013   Neck pain 12/05/2013   BMI 40.0-44.9, adult ([place]) 12/05/2013    PCP: [facility]  REFERRING PROVIDER: Billey Co, MD  REFERRING DIAG: M62.89 (ICD-10-CM) - Pelvic floor dysfunction in female   THERAPY DIAG:  Other lack of coordination  Muscle weakness (generalized)  Pelvic pain  Rationale for Evaluation and Treatment Rehabilitation  PRECAUTIONS: None  WEIGHT BEARING RESTRICTIONS No  FALLS:  Has patient fallen in last 6 months? No  ONSET DATE: 2018  Initial Evaluation: SUBJECTIVE:  CHIEF COMPLAINT: Patient notes that she has had longstanding abdominal pain 2/2 to endometriosis. Patient was referred because of consistent, monthly UTI symptoms that were not found to be related to bacterial. Patient was cleared from infection and from endometrial adhesions on the bladder. Patient notes symptoms include painful sex with deep penetration, increased urinary urgency and frequency after penetrative sex. Patient also deals with SUI.    PERTINENT HISTORY/CHART REVIEW:  Red flags (bowel/bladder changes, saddle paresthesia, personal history of cancer, h/o spinal tumors, h/o compression fx, h/o abdominal aneurysm,  abdominal pain, chills/fever, night sweats, nausea, vomiting, unrelenting pain, first onset of insidious LBP <20 y/o): Negative  02/2022 Urology Note: "36 year old female who reports typically 1 episode per month of a few days of severe urinary urgency.  Urine cultures have been negative or showed mixed flora, and she has had no improvement with antibiotics through PCP.  She denies any gross hematuria or dysuria, and the primary issue is urinary urgency.  She is taking cranberry pills over the last few months as well as a vaginal probiotic.  She drew primarily water during the day.  Her history is notable for endometriosis with history of vaginal hysterectomy, as well as a diagnostic laparoscopy with Dr. Ouida Sills in January 2020 for lysis of adhesions.  She also reports pain with sexual activity.   Urinalysis today with 11-20 squamous cells, but otherwise benign.  No recent imaging to review."   PAIN:  Are you having pain? Yes NPRS scale:  2/10 (current) suprapubic 2/10 (least) 9/10 (worst)- around cycle with cramping, with false UTIs Pain location: suprapubic (even with vaginal penetration) Pain type: constant Pain descriptors: stabbing Pain duration:   Aggravating factors: penetration, false UTIs Relieving factors: heating pad, fetal position  OCCUPATION/LEISURE ACTIVITIES:  Nail tech  PLOF:  Independent  PATIENT GOALS: "Less pain"  OBSTETRICAL HISTORY: G2P2 Deliveries: SVD Tearing/Episiotomy: none  GYNECOLOGICAL HISTORY: Hysterectomy: Yes Vaginal Endometriosis: Positive for history Last Menstrual Period:  Pain with exam: Yes  Prolapse: None Heaviness/pressure: No   UROLOGICAL HISTORY: Frequency of urination: every 2 hours; flare up every 30 min Incontinence: Coughing, Sneezing, and Laughing  Onset: 2013 Amount: Min Protective undergarments: No   Fluid Intake: ~ 50 oz H20, every other day coffee caffeinated, 1 cup juices- cranberry Nocturia:  1-2x/night Toileting posture: heels elevated Incomplete emptying: No  Pain with urination: Positive for suprapubic pain with flare up Stream: Strong; occasional spraying  Urgency: Yes (flare up) Difficulty initiating urination: Negative Intermittent stream: Negative Frequent UTI: Positive for suspicion, but urinalysis has been more often negative.   GASTROINTESTINAL HISTORY: Type of bowel movement: (Bristol Stool Scale) 6-7 Frequency of BMs: at least 3x/day Incomplete bowel movement: Yes (IBS) Pain with defecation: Positive for skin irritation. Straining with defecation: Negative Hemorrhoids: unsure, but feels they might exist  Toileting posture: heels elevated Incontinence: Negative.   SEXUAL HISTORY AND FUNCTION: Sexually active: Yes  Pain with penetration: deep thrusting, post-coital, and interrupts sexual activity Pain with external stimulation: No  Change in ability to achieve orgasm: Yes  Sexual abuse: No    OBJECTIVE:  DIAGNOSTIC TESTING/IMAGING: Normal cystoscopy   COGNITION:  Patient is oriented to person, place, and time.  Recent memory is intact.  Remote memory is intact.  Attention span and concentration are intact.  Expressive speech is intact.  Patient's fund of knowledge is within normal limits for educational level.    POSTURE/OBSERVATIONS:   Lumbar lordosis: WNL Thoracic kyphosis: WNL Iliac crest height: not formally assessed  Lumbar lateral shift:  not formally assessed  Pelvic obliquity: not formally assessed  Leg length discrepancy: not formally assessed    GAIT:  Grossly WFL; no apparent/significant aberrations noted.   04/13/2022 IC level and no noted rotation on assessment.  RANGE OF MOTION:   AROM (Normal range in degrees) AROM  04/13/2022  Lumbar   Flexion (65) WNL  Extension (30) WNL  Right lateral flexion (25) WNL  Left lateral flexion (25) WNL  Right rotation (30) WNL  Left rotation (30) WNL      Hip LEFT RIGHT  Flexion  (125) WNL WNL  Extension (15)    Abduction (40) WNL WNL  Adduction  WNL WNL  Internal Rotation (45) WNL WNL  External Rotation (45) WNL WNL  (* = pain; blank rows = not tested)  ABDOMINAL:   Palpation: no TTP Diastasis:3 finger separation below the umbilicus Scar mobility:  Rib flare: none noted   EXTERNAL PELVIC EXAM:  Patient educated on the purpose of the procedure/exam and articulated understanding and consented to the procedure/exam. and verbal  Breath coordination: present Voluntary Contraction: present (2-3/5MMT) Relaxation: non-relaxing Perineal movement with sustained IAP increase ("bear down"): descent Perineal movement with rapid IAP increase ("cough"): descent  06/22/2022 TREATMENT SUBJECTIVE: Patient reports that she has not had any significant pain flares since starting with PFPT. Patient does note that she continues to deal with increased PFM tension but is using stretches and breath work to help alleviate this. PAIN: 0/10, lower abdomen  Pre-treatment assessment:  Manual Therapy:   Neuromuscular Re-education: Seated TrA activation with coordinated breath Seated hip hinge with TrA activation and coordinated breath Review of PFM downtraining strategies.   Therapeutic Exercise:   Treatments unbilled:  Post-treatment assessment:  Patient educated throughout session on appropriate technique and form using multi-modal cueing, HEP, and activity modification. Patient articulated understanding and returned demonstration.  Patient Response to interventions:    PATIENT SURVEYS:  FOTO Urinary Problem 67; PFDI Pain 33; PFDI Urinary 17  ASSESSMENT:  Clinical Impression: Patient presents to clinic with excellent motivation to participate in therapy. Patient demonstrates deficits in PFM coordination, PFM strength, pain, and IAP management. Patient with excellent ability to activate TrA in seated and hinged postures during today's session and responded  positively to active interventions. Patient will benefit from continued skilled therapeutic intervention to address remaining deficits in PFM coordination, PFM strength, pain, and IAP management in order to increase function and improve overall QOL.     Objective impairments: decreased activity tolerance, decreased coordination, decreased endurance, decreased knowledge of condition, increased muscle spasms, improper body mechanics, and pain.   Activity limitations: interpersonal relationship, driving, shopping, and community activity.   Personal factors: Behavior pattern, Past/current experiences, Time since onset of injury/illness/exacerbation, and 3+ comorbidities: migraines, OSA, IBS, GERD, anxiety, depression, OCD  are also affecting patient's functional outcome.   Rehab Potential: Good  Clinical decision making: Evolving/moderate complexity  Evaluation complexity: Moderate   GOALS: Goals reviewed with patient? Yes  SHORT TERM GOALS: Target date: 05/18/2022  Patient will demonstrate independence with HEP in order to maximize therapeutic gains and improve carryover from physical therapy sessions to ADLs in the home and community. Baseline: not initiated; 11/16: IND Goal status: MET  LONG TERM GOALS: Target date: 06/29/2022  Patient will demonstrate improved function as evidenced by a score of 73 on FOTO measure for full participation in activities at home and in the community.  Baseline: 61; 11/16: 64 Goal status: IN PROGRESS  Patient will decrease worst pain as reported on  NPRS by at least 2 points to demonstrate clinically significant reduction in pain in order to restore/improve function and overall QOL. Baseline: 9/10; 11/16: 4-5/10 Goal status: IN PROGRESS  Patient will demonstrate coordinated lengthening and relaxation of PFM with diaphragmatic inhalation in order to decrease spasm and allow for unrestricted elimination of urine/feces for improved overall QOL. Baseline:  not formally assessed; 11/16: IND Goal status: IN PROGRESS   PLAN: Rehab frequency: 1x/week  Rehab duration: 12 weeks  Planned interventions: Therapeutic exercises, Therapeutic activity, Neuromuscular re-education, Balance training, Gait training, Patient/Family education, Self Care, Joint mobilization, Electrical stimulation, Spinal mobilization, Cryotherapy, Moist heat, scar mobilization, Taping, and Manual therapy     Myles Gip PT, DPT [phone]  06/22/2022, 9:04 AM

## 2022-06-29 ENCOUNTER — Ambulatory Visit: Payer: Medicaid Other | Admitting: Physical Therapy

## 2022-06-29 ENCOUNTER — Encounter: Payer: Self-pay | Admitting: Emergency Medicine

## 2022-06-29 ENCOUNTER — Ambulatory Visit
Admission: EM | Admit: 2022-06-29 | Discharge: 2022-06-29 | Disposition: A | Discharge status: Home / Self Care | Payer: Medicaid Other | Attending: Emergency Medicine | Admitting: Emergency Medicine

## 2022-06-29 DIAGNOSIS — J101 Influenza due to other identified influenza virus with other respiratory manifestations: Secondary | ICD-10-CM | POA: Insufficient documentation

## 2022-06-29 DIAGNOSIS — R059 Cough, unspecified: Secondary | ICD-10-CM | POA: Diagnosis present

## 2022-06-29 DIAGNOSIS — Z1152 Encounter for screening for COVID-19: Secondary | ICD-10-CM | POA: Diagnosis not present

## 2022-06-29 DIAGNOSIS — J45909 Unspecified asthma, uncomplicated: Secondary | ICD-10-CM | POA: Insufficient documentation

## 2022-06-29 LAB — RAPID INFLUENZA A&B ANTIGENS
Influenza A (ARMC): NEGATIVE
Influenza B (ARMC): POSITIVE — AB

## 2022-06-29 LAB — GROUP A STREP BY PCR: Group A Strep by PCR: NOT DETECTED

## 2022-06-29 LAB — SARS CORONAVIRUS 2 BY RT PCR: SARS Coronavirus 2 by RT PCR: NEGATIVE

## 2022-06-29 MED ORDER — PROMETHAZINE-DM 6.25-15 MG/5ML PO SYRP: 5.0000 mL | ORAL_SOLUTION | Freq: Four times a day (QID) | ORAL | 0 refills | Status: DC | PRN

## 2022-06-29 MED ORDER — OSELTAMIVIR PHOSPHATE 75 MG PO CAPS: 75.0000 mg | ORAL_CAPSULE | Freq: Two times a day (BID) | ORAL | 0 refills | Status: DC

## 2022-06-29 MED ORDER — ALBUTEROL SULFATE HFA 108 (90 BASE) MCG/ACT IN AERS: 1.0000 | INHALATION_SPRAY | Freq: Four times a day (QID) | RESPIRATORY_TRACT | 0 refills | Status: DC | PRN

## 2022-06-29 NOTE — ED Provider Notes (Signed)
[facility]    CSN: [id number] Arrival date & time: 06/29/22  1815      History   Chief Complaint Chief Complaint  Patient presents with   Cough   Headache   Generalized Body Aches   Nasal Congestion    HPI [name] [name] is a 36 y.o. femalewho presents for evaluation of URI symptoms for 2 days. Patient reports associated symptoms of cough, congestion, chills, body aches, sore throat. Denies N/V/D, fevers, ear pain, shortness of breath. Patient does does have a hx of asthma. No smoking.  Daughter has strep.  no recent travel. Pt is vaccinated for COVID. Pt is vaccinated for flu this season. Pt has taken Tylenol OTC for symptoms. Pt has no other concerns at this time.    Cough Associated symptoms: headaches, myalgias and sore throat   Headache Associated symptoms: congestion, cough, myalgias and sore throat     Past Medical History:  Diagnosis Date   Abdominal cramping    Asthma    as a child   Headache    migraines   Heart murmur    asd murmur. saw cardiologist until age 63 and it was so small. rechecked 6 years ago and all was fine    Patient Active Problem List   Diagnosis Date Noted   Elevated serum creatinine 08/11/2021   Obsessive-compulsive disorder 08/11/2021   CPAP use counseling 11/30/2020   Gastroesophageal reflux disease without esophagitis 11/04/2020   Excessive daytime sleepiness 08/11/2020   Rebound headache 08/11/2020   Upper back pain, chronic 02/24/2020   Mild obstructive sleep apnea 12/31/2019   Atypical chest pain 12/30/2019   History of depression 09/05/2019   History of Lyme disease 09/05/2019   Seasonal allergic rhinitis 09/05/2019   Asthma without status asthmaticus 06/19/2018   Cardiac murmur 06/19/2018   Dyspareunia, female 06/19/2018   Exertional dyspnea 06/19/2018   Former tobacco use 06/19/2018   IBS (irritable bowel syndrome) 06/19/2018   Knee pain 06/19/2018   Pelvic pain in female 06/19/2018   Migraine  06/19/2018   Endometriosis of pelvis 09/27/2017   Postoperative state 09/11/2017   Radiculopathy of lumbar region 08/31/2016   Anxiety 12/05/2013   Vitamin B12 deficiency 12/05/2013   Depression 12/05/2013   Neck pain 12/05/2013   BMI 40.0-44.9, adult (HCC) 12/05/2013    Past Surgical History:  Procedure Laterality Date   KNEE ARTHROSCOPY Right 2004   LAPAROSCOPIC VAGINAL HYSTERECTOMY WITH SALPINGECTOMY Bilateral 09/11/2017   Procedure: LAPAROSCOPIC ASSISTED VAGINAL HYSTERECTOMY WITH SALPINGECTOMY;  Surgeon: Suzy Bouchard, MD;  Location: [facility];  Service: Gynecology;  Laterality: Bilateral;   LAPAROSCOPY N/A 08/03/2018   Procedure: LAPAROSCOPY OPERATIVE,;  Surgeon: Suzy Bouchard, MD;  Location: [facility];  Service: Gynecology;  Laterality: N/A;   LYSIS OF ADHESION N/A 08/03/2018   Procedure: LYSIS OF ADHESION;  Surgeon: Schermerhorn, Ihor Austin, MD;  Location: [facility];  Service: Gynecology;  Laterality: N/A;   TONSILLECTOMY  age 19    OB History   No obstetric history on file.      Home Medications    Prior to Admission medications   Medication Sig Start Date End Date Taking? Authorizing Provider  albuterol (VENTOLIN HFA) 108 (90 Base) MCG/ACT inhaler Inhale 1-2 puffs into the lungs every 6 (six) hours as needed for wheezing or shortness of breath. 06/29/22  Yes Radford Pax, NP  oseltamivir (TAMIFLU) 75 MG capsule Take 1 capsule (75 mg total) by mouth every 12 (twelve) hours. 06/29/22  Yes Cheri Rous  R, NP  promethazine-dextromethorphan (PROMETHAZINE-DM) 6.25-15 MG/5ML syrup Take 5 mLs by mouth 4 (four) times daily as needed for cough. 06/29/22  Yes Radford Pax, NP  butalbital-acetaminophen-caffeine (FIORICET) [phone] MG tablet Take by mouth. 02/22/22   [provider]  cyanocobalamin (,VITAMIN B-12,) 1000 MCG/ML injection SMARTSIG:0.1 Milliliter(s) IM Every 2 Weeks 10/15/21   [provider]  cyclobenzaprine (FLEXERIL) 10 MG tablet Take 10 mg  by mouth at bedtime. 02/17/22   [provider]  EMGALITY 120 MG/ML SOAJ SMARTSIG:120 Milligram(s) SUB-Q Once a Month 03/02/22   [provider]  EPINEPHrine 0.3 mg/0.3 mL IJ SOAJ injection See admin instructions. 09/03/20   [provider]  famotidine (PEPCID) 20 MG tablet Take by mouth. 09/16/21   [provider]  ferrous sulfate 325 (65 FE) MG tablet Take 325 mg by mouth daily with breakfast.    [provider]  FLUoxetine (PROZAC) 20 MG capsule Take 1 tablet by mouth daily. 03/11/22   [provider]  montelukast (SINGULAIR) 10 MG tablet Take by mouth. 09/16/21   [provider]  triamcinolone (NASACORT) 55 MCG/ACT AERO nasal inhaler 2 sprays daily. 10/15/21   [provider]    Family History Family History  Problem Relation Age of Onset   Cardiomyopathy Father    Migraines Mother    Breast cancer Maternal Grandmother 10    Social History Social History   Tobacco Use   Smoking status: Former    Types: Cigarettes    Quit date: 07/27/2014    Years since quitting: 7.9    Passive exposure: Past   Smokeless tobacco: Never  Vaping Use   Vaping Use: Never used  Substance Use Topics   Alcohol use: Yes    Alcohol/week: 0.0 standard drinks of alcohol    Comment: rarely   Drug use: No     Allergies   Azithromycin, Fluticasone, Fluticasone propionate, Tiotropium, and Topamax [topiramate]   Review of Systems Review of Systems  HENT:  Positive for congestion and sore throat.   Respiratory:  Positive for cough.   Musculoskeletal:  Positive for myalgias.  Neurological:  Positive for headaches.     Physical Exam Triage Vital Signs ED Triage Vitals  Enc Vitals Group     BP 06/29/22 1942 (!) 119/100     Pulse Rate 06/29/22 1942 92     Resp 06/29/22 1942 18     Temp 06/29/22 1942 98.2 F (36.8 C)     Temp Source 06/29/22 1942 Oral     SpO2 06/29/22 1942 96 %     Weight --      Height --      Head  Circumference --      Peak Flow --      Pain Score 06/29/22 1939 4     Pain Loc --      Pain Edu? --      Excl. in GC? --    No data found.  Updated Vital Signs BP (!) 119/100 (BP Location: Left Arm)   Pulse 92   Temp 98.2 F (36.8 C) (Oral)   Resp 18   LMP 01/16/2017   SpO2 96%   Visual Acuity Right Eye Distance:   Left Eye Distance:   Bilateral Distance:    Right Eye Near:   Left Eye Near:    Bilateral Near:     Physical Exam Vitals and nursing note reviewed.  Constitutional:      General: She is not in acute distress.  Appearance: She is well-developed. She is not ill-appearing.  HENT:     Head: Normocephalic and atraumatic.     Right Ear: Tympanic membrane and ear canal normal.     Left Ear: Tympanic membrane and ear canal normal.     Nose: Congestion present.     Mouth/Throat:     Mouth: Mucous membranes are moist.     Pharynx: Oropharynx is clear. Uvula midline. Posterior oropharyngeal erythema present.     Tonsils: No tonsillar exudate or tonsillar abscesses.  Eyes:     Conjunctiva/sclera: Conjunctivae normal.     Pupils: Pupils are equal, round, and reactive to light.  Cardiovascular:     Rate and Rhythm: Normal rate and regular rhythm.     Heart sounds: Normal heart sounds.  Pulmonary:     Effort: Pulmonary effort is normal.     Breath sounds: Normal breath sounds.  Musculoskeletal:     Cervical back: Normal range of motion and neck supple.  Lymphadenopathy:     Cervical: No cervical adenopathy.  Skin:    General: Skin is warm and dry.  Neurological:     General: No focal deficit present.     Mental Status: She is alert and oriented to person, place, and time.  Psychiatric:        Mood and Affect: Mood normal.        Behavior: Behavior normal.      UC Treatments / Results  Labs (all labs ordered are listed, but only abnormal results are displayed) Labs Reviewed  RAPID INFLUENZA A&B ANTIGENS - Abnormal; Notable for the following  components:      Result Value   Influenza B ([facility]) POSITIVE (*)    All other components within normal limits  GROUP A STREP BY PCR  SARS CORONAVIRUS 2 BY RT PCR    EKG   Radiology No results found.  Procedures Procedures (including critical care time)  Medications Ordered in UC Medications - No data to display  Initial Impression / Assessment and Plan / UC Course  I have reviewed the triage vital signs and the nursing notes.  Pertinent labs & imaging results that were available during my care of the patient were reviewed by me and considered in my medical decision making (see chart for details).  Clinical Course as of 06/29/22 2036  Wed Jun 29, 2022  2036 BP 124/90 [JM]    Clinical Course User Index [JM] Radford Pax, NP    Positive influenza B, negative strep Start Tamiflu Albuterol inhaler Promethazine as needed for cough Rest and fluids PCP follow-up 2 to 3 days for recheck ER for any worsening symptoms Final Clinical Impressions(s) / UC Diagnoses   Final diagnoses:  Influenza B   Discharge Instructions   None    ED Prescriptions     Medication Sig Dispense Auth. Provider   oseltamivir (TAMIFLU) 75 MG capsule Take 1 capsule (75 mg total) by mouth every 12 (twelve) hours. 10 capsule Radford Pax, NP   albuterol (VENTOLIN HFA) 108 (90 Base) MCG/ACT inhaler Inhale 1-2 puffs into the lungs every 6 (six) hours as needed for wheezing or shortness of breath. 1 each Radford Pax, NP   promethazine-dextromethorphan (PROMETHAZINE-DM) 6.25-15 MG/5ML syrup Take 5 mLs by mouth 4 (four) times daily as needed for cough. 118 mL Radford Pax, NP      PDMP not reviewed this encounter.   Radford Pax, NP 06/29/22 2036

## 2022-06-29 NOTE — ED Triage Notes (Addendum)
Pt presents with cough, bodyaches, runny nose and headache x 3 days. Patients daughter tested positive for strep today.

## 2022-09-27 ENCOUNTER — Ambulatory Visit: Payer: Medicaid Other | Admitting: Urology

## 2022-12-31 IMAGING — CR DG CHEST 2V
2 series · 2 of 2 positions shown · non-contrast
Comparison: Prior chest x-ray 06/19/2018

CLINICAL DATA: Cough, shortness of breath

EXAM:
CHEST - 2 VIEW

[chest pa]
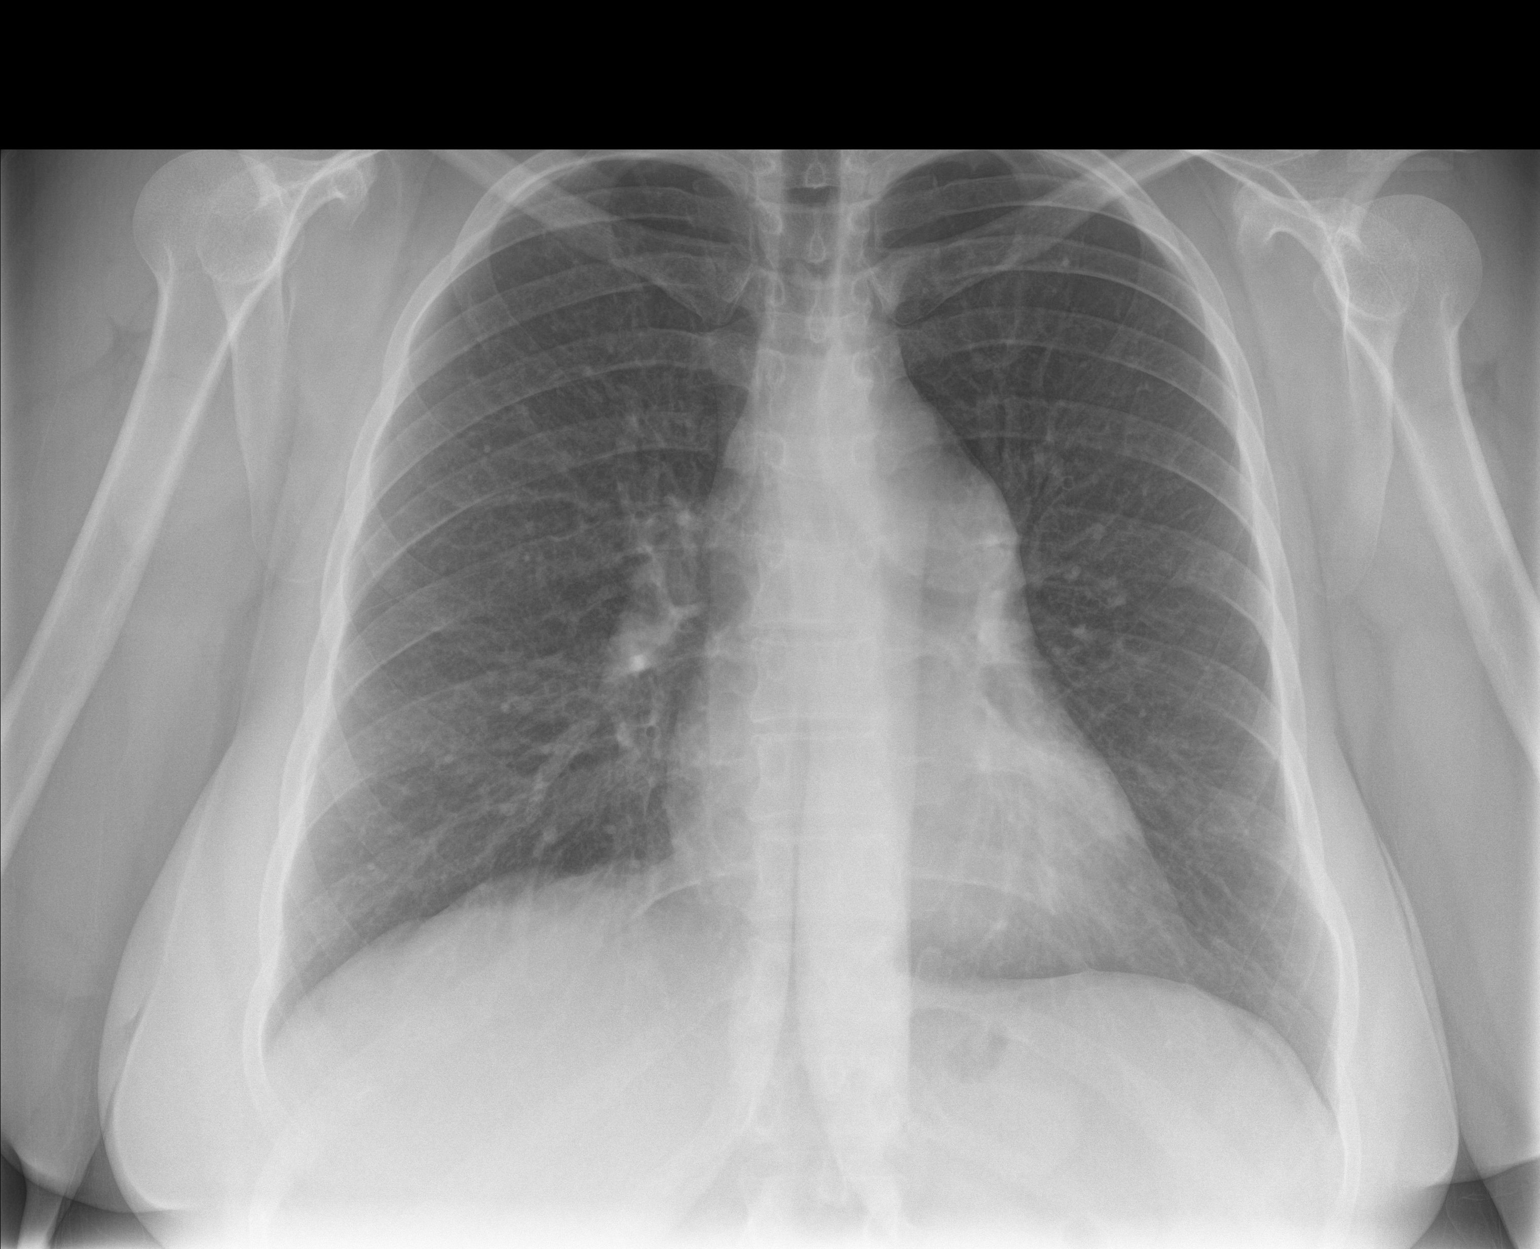

[chest lat]
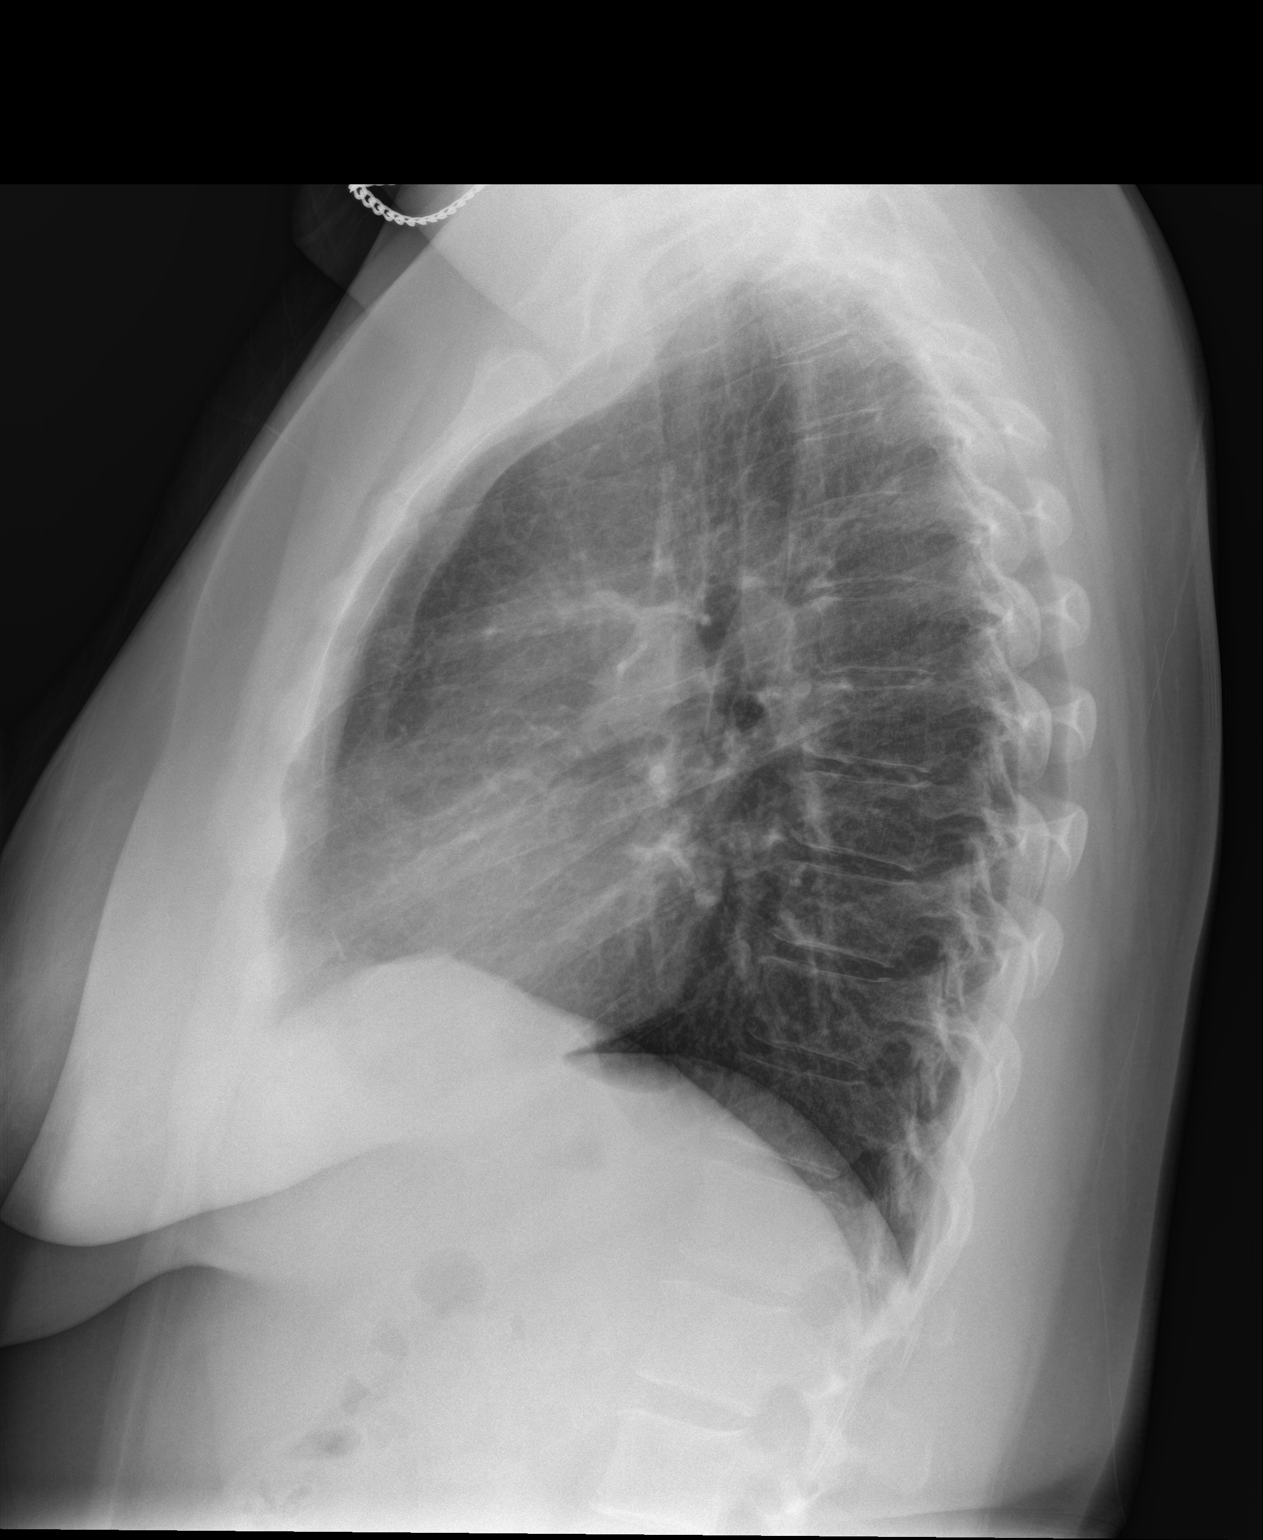

[2 of 2 positions shown; findings below may reference images not displayed]

FINDINGS: The lungs are clear and negative for focal airspace consolidation,
pulmonary edema or suspicious pulmonary nodule. Mild prominence of
the contour of the main pulmonary artery without significant change
compared to prior imaging. No pleural effusion or pneumothorax.
Cardiac and mediastinal contours are within normal limits. No acute
fracture or lytic or blastic osseous lesions. The visualized upper
abdominal bowel gas pattern is unremarkable.
IMPRESSION: No acute cardiopulmonary process. Unchanged appearance of the chest
compared to 06/19/2018.

## 2023-04-20 DIAGNOSIS — F411 Generalized anxiety disorder: Secondary | ICD-10-CM | POA: Insufficient documentation

## 2023-08-27 NOTE — Progress Notes (Unsigned)
Sleep Medicine   Office Visit  Patient Name: [name] [name] DOB: 16-Dec-1985 MRN [id number]    Chief Complaint: history of OSA  Brief History:  Rickie presents for a follow up  sleep evaluation with a 3 year history of diagnosed OSA. Patient was previously on APAP 4-12cmH20 - returned the machine 03/19/21.  Patient reports having trouble tolerating the mask and taking it off at night. Patient is here for a restart of her therapy. Patient reports his sleep quality is poor due to never feeling rested. This is noted all nights. The patient's bed partner reports  snoring and restless sleep  at night. The patient relates the following symptoms : snoring, waking groggy and tired, morning headaches, brain fog, lack of focus, sleepy driving and EDS are also present. The patient goes to sleep at 11:00pm  and wakes up at 7:00am. Patient reports waking 2-3 times per night and she isn't sure why she wakes. Sleep quality is the same when outside home environment.  Patient has noted occasional restlessness of her legs at night.  The patient  relates no unusual behavior during the night.  The patient reports a history of psychiatric problems. The Epworth Sleepiness Score is 15 out of 24 .  The patient relates  Cardiovascular risk factors include: heart murmur.    ROS  General: (-) fever, (-) chills, (-) night sweat Nose and Sinuses: (-) nasal stuffiness or itchiness, (-) postnasal drip, (-) nosebleeds, (-) sinus trouble. Mouth and Throat: (-) sore throat, (-) hoarseness. Neck: (-) swollen glands, (-) enlarged thyroid, (-) neck pain. Respiratory: - cough, - shortness of breath, - wheezing. Neurologic: - numbness, - tingling. Psychiatric: + anxiety, - depression Sleep behavior: -sleep paralysis -hypnogogic hallucinations -dream enactment      -vivid dreams -cataplexy -night terrors -sleep walking   Current Medication: Outpatient Encounter Medications as of 08/28/2023  Medication Sig    Semaglutide-Weight Management 0.25 MG/0.5ML SOAJ Inject 0.25 mg into the skin.   sertraline (ZOLOFT) 50 MG tablet Take 1 tablet by mouth daily.   UBRELVY 100 MG TABS TAKE 100MG  AT HEADACHE ONSET. CAN REPEAT AFTER 2 HOURS IF NEEDED. DO NOT EXCEED 200MG  IN 24 HOURS.   albuterol (VENTOLIN HFA) 108 (90 Base) MCG/ACT inhaler Inhale 1-2 puffs into the lungs every 6 (six) hours as needed for wheezing or shortness of breath.   baclofen (LIORESAL) 10 MG tablet Take 10 mg by mouth 2 (two) times daily.   butalbital-acetaminophen-caffeine (FIORICET) 50-325-40 MG tablet Take by mouth.   cyanocobalamin (,VITAMIN B-12,) 1000 MCG/ML injection SMARTSIG:0.1 Milliliter(s) IM Every 2 Weeks   cyclobenzaprine (FLEXERIL) 10 MG tablet Take 10 mg by mouth at bedtime.   EMGALITY 120 MG/ML SOAJ SMARTSIG:120 Milligram(s) SUB-Q Once a Month   EPINEPHrine 0.3 mg/0.3 mL IJ SOAJ injection See admin instructions.   famotidine (PEPCID) 20 MG tablet Take by mouth.   ferrous sulfate 325 (65 FE) MG tablet Take 325 mg by mouth daily with breakfast.   FLUoxetine (PROZAC) 20 MG capsule Take 1 tablet by mouth daily.   montelukast (SINGULAIR) 10 MG tablet Take by mouth.   oseltamivir (TAMIFLU) 75 MG capsule Take 1 capsule (75 mg total) by mouth every 12 (twelve) hours.   promethazine-dextromethorphan (PROMETHAZINE-DM) 6.25-15 MG/5ML syrup Take 5 mLs by mouth 4 (four) times daily as needed for cough.   triamcinolone (NASACORT) 55 MCG/ACT AERO nasal inhaler 2 sprays daily.   No facility-administered encounter medications on file as of 08/28/2023.    Surgical History: Past Surgical History:  Procedure Laterality Date   KNEE ARTHROSCOPY Right 2004   LAPAROSCOPIC VAGINAL HYSTERECTOMY WITH SALPINGECTOMY Bilateral 09/11/2017   Procedure: LAPAROSCOPIC ASSISTED VAGINAL HYSTERECTOMY WITH SALPINGECTOMY;  Surgeon: Schermerhorn, Ihor Austin, MD;  Location: [facility];  Service: Gynecology;  Laterality: Bilateral;   LAPAROSCOPY N/A 08/03/2018    Procedure: LAPAROSCOPY OPERATIVE,;  Surgeon: Suzy Bouchard, MD;  Location: [facility];  Service: Gynecology;  Laterality: N/A;   LYSIS OF ADHESION N/A 08/03/2018   Procedure: LYSIS OF ADHESION;  Surgeon: Schermerhorn, Ihor Austin, MD;  Location: [facility];  Service: Gynecology;  Laterality: N/A;   TONSILLECTOMY  age 17    Medical History: Past Medical History:  Diagnosis Date   Abdominal cramping    Asthma    as a child   Headache    migraines   Heart murmur    asd murmur. saw cardiologist until age 76 and it was so small. rechecked 6 years ago and all was fine    Family History: Non contributory to the present illness  Social History: Social History   Socioeconomic History   Marital status: Single    Spouse name: Not on file   Number of children: Not on file   Years of education: Not on file   Highest education level: Not on file  Occupational History   Not on file  Tobacco Use   Smoking status: Former    Current packs/day: 0.00    Types: Cigarettes    Quit date: 07/27/2014    Years since quitting: 9.0    Passive exposure: Past   Smokeless tobacco: Never  Vaping Use   Vaping status: Never Used  Substance and Sexual Activity   Alcohol use: Yes    Alcohol/week: 0.0 standard drinks of alcohol    Comment: rarely   Drug use: No   Sexual activity: Yes  Other Topics Concern   Not on file  Social History Narrative   Not on file   Social Drivers of Health   Financial Resource Strain: Low Risk  (09/27/2022)   Received from [facility]   Overall Financial Resource Strain (CARDIA)    Difficulty of Paying Living Expenses: Not hard at all  Food Insecurity: No Food Insecurity (07/20/2023)   Received from [facility]   Hunger Vital Sign    Worried About Running Out of Food in the Last Year: Never true    Ran Out of Food in the Last Year: Never true  Transportation Needs: No Transportation Needs (07/20/2023)   Received from [facility] -  Transportation    Lack of Transportation (Medical): No    Lack of Transportation (Non-Medical): No  Physical Activity: Not on file  Stress: Not on file  Social Connections: Not on file  Intimate Partner Violence: Not on file    Vital Signs: Blood pressure 122/87, pulse 73, resp. rate 16, height 5\' 2"  (1.575 m), weight 259 lb 11.2 oz (117.8 kg), last menstrual period 01/16/2017, SpO2 96%. Body mass index is 47.5 kg/m.   Examination: General Appearance: The patient is well-developed, well-nourished, and in no distress. Neck Circumference: 39cm Skin: Gross inspection of skin unremarkable. Head: normocephalic, no gross deformities. Eyes: no gross deformities noted. ENT: ears appear grossly normal Neurologic: Alert and oriented. No involuntary movements.    STOP BANG RISK ASSESSMENT S (snore) Have you been told that you snore?     YES/N   T (tired) Are you often tired, fatigued, or sleepy during the day?  YES/NO  O (obstruction) Do you stop breathing, choke, or gasp during sleep? YES/NO   P (pressure) Do you have or are you being treated for high blood pressure? YES/NO   B (BMI) Is your body index greater than 35 kg/m? YES/NO   A (age) Are you 37 years old or older? YES/NO   N (neck) Do you have a neck circumference greater than 16 inches?   YES/NO   G (gender) Are you a female? YES/NO   TOTAL STOP/BANG "YES" ANSWERS                                                                A STOP-Bang score of 2 or less is considered low risk, and a score of 5 or more is high risk for having either moderate or severe OSA. For people who score 3 or 4, doctors may need to perform further assessment to determine how likely they are to have OSA.         EPWORTH SLEEPINESS SCALE:  Scale:  (0)= no chance of dozing; (1)= slight chance of dozing; (2)= moderate chance of dozing; (3)= high chance of dozing  Chance  Situtation    Sitting and reading: 3    Watching TV: 3    Sitting  Inactive in public: 1    As a passenger in car: 3      Lying down to rest: 3    Sitting and talking: 0    Sitting quielty after lunch: 2    In a car, stopped in traffic: 0   TOTAL SCORE:   15 out of 24    SLEEP STUDIES:  HST - [facility] 06/18/2020 - REI 9/hr  Supine REI 11/hr  min Sp02 81%   LABS: No results found for this or any previous visit (from the past 2160 hours).  Radiology: No results found.  No results found.  No results found.    Assessment and Plan: Patient Active Problem List   Diagnosis Date Noted   Elevated serum creatinine 08/11/2021   Obsessive-compulsive disorder 08/11/2021   CPAP use counseling 11/30/2020   Gastroesophageal reflux disease without esophagitis 11/04/2020   Excessive daytime sleepiness 08/11/2020   Rebound headache 08/11/2020   Upper back pain, chronic 02/24/2020   Mild obstructive sleep apnea 12/31/2019   Atypical chest pain 12/30/2019   History of depression 09/05/2019   History of Lyme disease 09/05/2019   Seasonal allergic rhinitis 09/05/2019   Asthma without status asthmaticus 06/19/2018   Cardiac murmur 06/19/2018   Dyspareunia, female 06/19/2018   Exertional dyspnea 06/19/2018   Former tobacco use 06/19/2018   IBS (irritable bowel syndrome) 06/19/2018   Knee pain 06/19/2018   Pelvic pain in female 06/19/2018   Migraine 06/19/2018   Endometriosis of pelvis 09/27/2017   Postoperative state 09/11/2017   Radiculopathy of lumbar region 08/31/2016   Anxiety 12/05/2013   Vitamin B12 deficiency 12/05/2013   Depression 12/05/2013   Neck pain 12/05/2013   BMI 40.0-44.9, adult (HCC) 12/05/2013   1. OSA (obstructive sleep apnea) (Primary)  Patient evaluation suggests high risk of sleep disordered breathing due to history of OSA, snoring, brain fog, morning headaches, daytime sleepiness.  Patient has comorbid cardiovascular risk factors including: heart murmur which could be exacerbated by pathologic sleep-disordered  breathing.  Suggest: PSG to assess/treat the patient's sleep disordered breathing. The patient was also counselled on weight loss to optimize sleep health.  2. Morbid obesity (HCC) Obesity Counseling: Had a lengthy discussion regarding patients BMI and weight issues. Patient was instructed on portion control as well as increased activity. Also discussed caloric restrictions with trying to maintain intake less than 2000 Kcal. Discussions were made in accordance with the 5As of weight management. Simple actions such as not eating late and if able to, taking a walk is suggested.   General Counseling: I have discussed the findings of the evaluation and examination with Elianys.  I have also discussed any further diagnostic evaluation thatmay be needed or ordered today. Kenyetta verbalizes understanding of the findings of todays visit. We also reviewed her medications today and discussed drug interactions and side effects including but not limited excessive drowsiness and altered mental states. We also discussed that there is always a risk not just to her but also people around her. she has been encouraged to call the office with any questions or concerns that should arise related to todays visit.  No orders of the defined types were placed in this encounter.       I have personally obtained a history, evaluated the patient, evaluated pertinent data, formulated the assessment and plan and placed orders.   This patient was seen today by Emmaline Kluver, PA-C in collaboration with Dr. Freda Munro.   Yevonne Pax, MD [facility] Diplomate ABMS Pulmonary and Critical Care Medicine Sleep medicine

## 2023-08-28 ENCOUNTER — Ambulatory Visit (INDEPENDENT_AMBULATORY_CARE_PROVIDER_SITE_OTHER): Payer: Medicaid Other | Admitting: Internal Medicine

## 2023-08-28 VITALS — BP 122/87 | HR 73 | Resp 16 | Ht 62.0 in | Wt 259.7 lb

## 2023-08-28 DIAGNOSIS — G4733 Obstructive sleep apnea (adult) (pediatric): Secondary | ICD-10-CM

## 2023-11-24 ENCOUNTER — Ambulatory Visit
Admission: EM | Admit: 2023-11-24 | Discharge: 2023-11-24 | Disposition: A | Discharge status: Home / Self Care | Attending: Emergency Medicine | Admitting: Emergency Medicine

## 2023-11-24 ENCOUNTER — Encounter: Payer: Self-pay | Admitting: Emergency Medicine

## 2023-11-24 DIAGNOSIS — J029 Acute pharyngitis, unspecified: Secondary | ICD-10-CM | POA: Insufficient documentation

## 2023-11-24 DIAGNOSIS — G43019 Migraine without aura, intractable, without status migrainosus: Secondary | ICD-10-CM | POA: Insufficient documentation

## 2023-11-24 LAB — GROUP A STREP BY PCR: Group A Strep by PCR: NOT DETECTED

## 2023-11-24 MED ORDER — DEXAMETHASONE SODIUM PHOSPHATE 10 MG/ML IJ SOLN
10.0000 mg | Freq: Once | INTRAMUSCULAR | Status: AC
  Administered 2023-11-24: 10 mg via INTRAMUSCULAR

## 2023-11-24 MED ORDER — PREDNISONE 10 MG (21) PO TBPK: ORAL_TABLET | ORAL | 0 refills | Status: DC

## 2023-11-24 NOTE — Discharge Instructions (Addendum)
 Starting tomorrow morning begin taking the prednisone  taper according the package instructions to help break your migraine.  You may continue to use your rizatriptan as prescribed.  To also help your migraine headache I suggest using the following supplements: Coenzyme Q 10 300 mg once daily Vitamin B2 200 to 250 mg twice daily Magnesium 400 low grams at bedtime.  Your strep test today was negative but I do believe you have a virus which is causing your symptoms.  Gargle with warm salt water 2-3 times a day to soothe your throat, aid in pain relief, and aid in healing.  Take over-the-counter ibuprofen  according to the package instructions as needed for pain.  You can also use Chloraseptic or Sucrets lozenges, 1 lozenge every 2 hours as needed for throat pain.  If you develop any new or worsening symptoms return for reevaluation.

## 2023-11-24 NOTE — ED Provider Notes (Signed)
 [facility]    CSN: [id number] Arrival date & time: 11/24/23  1859      History   Chief Complaint Chief Complaint  Patient presents with   Migraine   Sore Throat    HPI Shinita Nicole is a 38 y.o. female.   HPI  38 year old female with past medical history significant for migraine headaches, asthma, heart murmur, and abdominal cramping presents for evaluation of migraine headache that started 5 days ago and sore throat that began last night.  She reports that her headache is in her normal pattern and that is in the occipital area.  This is associated with nausea but no vomiting.  She also denies any auras.  She is awaiting a new patient appointment with the [facility] in [place] but she is currently prescribed rizatriptan by her neurologist.  She reports that she has taken rizatriptan, ibuprofen , and Excedrin without any improvement of symptoms.    Past Medical History:  Diagnosis Date   Abdominal cramping    Asthma    as a child   Headache    migraines   Heart murmur    asd murmur. saw cardiologist until age 3 and it was so small. rechecked 6 years ago and all was fine    Patient Active Problem List   Diagnosis Date Noted   OSA (obstructive sleep apnea) 08/28/2023   Morbid obesity (HCC) 08/28/2023   Elevated serum creatinine 08/11/2021   Obsessive-compulsive disorder 08/11/2021   CPAP use counseling 11/30/2020   Gastroesophageal reflux disease without esophagitis 11/04/2020   Excessive daytime sleepiness 08/11/2020   Rebound headache 08/11/2020   Upper back pain, chronic 02/24/2020   Mild obstructive sleep apnea 12/31/2019   Atypical chest pain 12/30/2019   History of depression 09/05/2019   History of Lyme disease 09/05/2019   Seasonal allergic rhinitis 09/05/2019   Asthma without status asthmaticus 06/19/2018   Cardiac murmur 06/19/2018   Dyspareunia, female 06/19/2018   Exertional dyspnea 06/19/2018   Former tobacco use  06/19/2018   IBS (irritable bowel syndrome) 06/19/2018   Knee pain 06/19/2018   Pelvic pain in female 06/19/2018   Migraine 06/19/2018   Endometriosis of pelvis 09/27/2017   Postoperative state 09/11/2017   Radiculopathy of lumbar region 08/31/2016   Anxiety 12/05/2013   Vitamin B12 deficiency 12/05/2013   Depression 12/05/2013   Neck pain 12/05/2013   BMI 40.0-44.9, adult (HCC) 12/05/2013    Past Surgical History:  Procedure Laterality Date   KNEE ARTHROSCOPY Right 2004   LAPAROSCOPIC VAGINAL HYSTERECTOMY WITH SALPINGECTOMY Bilateral 09/11/2017   Procedure: LAPAROSCOPIC ASSISTED VAGINAL HYSTERECTOMY WITH SALPINGECTOMY;  Surgeon: Carolynn Citrin, MD;  Location: [facility];  Service: Gynecology;  Laterality: Bilateral;   LAPAROSCOPY N/A 08/03/2018   Procedure: LAPAROSCOPY OPERATIVE,;  Surgeon: Carolynn Citrin, MD;  Location: [facility];  Service: Gynecology;  Laterality: N/A;   LYSIS OF ADHESION N/A 08/03/2018   Procedure: LYSIS OF ADHESION;  Surgeon: Schermerhorn, Joselyn Nicely, MD;  Location: [facility];  Service: Gynecology;  Laterality: N/A;   TONSILLECTOMY  age 50    OB History   No obstetric history on file.      Home Medications    Prior to Admission medications   Medication Sig Start Date End Date Taking? Authorizing Provider  famotidine  (PEPCID ) 20 MG tablet Take by mouth. 09/16/21  Yes [provider]  montelukast (SINGULAIR) 10 MG tablet Take by mouth. 09/16/21  Yes [provider]  predniSONE  (STERAPRED UNI-PAK 21 TAB) 10 MG (  21) TBPK tablet Take 6 tablets on day 1, 5 tablets day 2, 4 tablets day 3, 3 tablets day 4, 2 tablets day 5, 1 tablet day 6 11/24/23  Yes Kent Pear, NP  Semaglutide-Weight Management 0.25 MG/0.5ML SOAJ Inject 0.25 mg into the skin. 08/17/23  Yes [provider]  sertraline (ZOLOFT) 50 MG tablet Take 1 tablet by mouth daily. 08/13/23 08/12/24 Yes [provider]  albuterol  (VENTOLIN  HFA) 108 (90 Base) MCG/ACT inhaler  Inhale 1-2 puffs into the lungs every 6 (six) hours as needed for wheezing or shortness of breath. 06/29/22   Mayer, Jodi R, NP  baclofen (LIORESAL) 10 MG tablet Take 10 mg by mouth 2 (two) times daily.    [provider]  butalbital-acetaminophen -caffeine (FIORICET) 50-325-40 MG tablet Take by mouth. 02/22/22   [provider]  cyanocobalamin (,VITAMIN B-12,) 1000 MCG/ML injection SMARTSIG:0.1 Milliliter(s) IM Every 2 Weeks 10/15/21   [provider]  cyclobenzaprine  (FLEXERIL ) 10 MG tablet Take 10 mg by mouth at bedtime. 02/17/22   [provider]  EMGALITY 120 MG/ML SOAJ SMARTSIG:120 Milligram(s) SUB-Q Once a Month 03/02/22   [provider]  EPINEPHrine  0.3 mg/0.3 mL IJ SOAJ injection See admin instructions. 09/03/20   [provider]  ferrous sulfate 325 (65 FE) MG tablet Take 325 mg by mouth daily with breakfast.    [provider]  FLUoxetine (PROZAC) 20 MG capsule Take 1 tablet by mouth daily. 03/11/22   [provider]  oseltamivir  (TAMIFLU ) 75 MG capsule Take 1 capsule (75 mg total) by mouth every 12 (twelve) hours. 06/29/22   Mayer, Jodi R, NP  promethazine -dextromethorphan (PROMETHAZINE -DM) 6.25-15 MG/5ML syrup Take 5 mLs by mouth 4 (four) times daily as needed for cough. 06/29/22   Mayer, Jodi R, NP  triamcinolone  (NASACORT ) 55 MCG/ACT AERO nasal inhaler 2 sprays daily. 10/15/21   [provider]  UBRELVY 100 MG TABS TAKE 100MG  AT HEADACHE ONSET. CAN REPEAT AFTER 2 HOURS IF NEEDED. DO NOT EXCEED 200MG  IN 24 HOURS. 09/12/22   [provider]    Family History Family History  Problem Relation Age of Onset   Cardiomyopathy Father    Migraines Mother    Breast cancer Maternal Grandmother 52    Social History Social History   Tobacco Use   Smoking status: Former    Current packs/day: 0.00    Types: Cigarettes    Quit date: 07/27/2014    Years since quitting: 9.3    Passive exposure: Past   Smokeless  tobacco: Never  Vaping Use   Vaping status: Never Used  Substance Use Topics   Alcohol use: Yes    Alcohol/week: 0.0 standard drinks of alcohol    Comment: rarely   Drug use: No     Allergies   Azithromycin, Fluticasone , Fluticasone  propionate, Tiotropium, and Topamax [topiramate]   Review of Systems Review of Systems  Constitutional:  Negative for fever.  HENT:  Positive for sore throat. Negative for congestion, ear pain and rhinorrhea.   Eyes:  Negative for visual disturbance.  Gastrointestinal:  Positive for nausea. Negative for vomiting.  Neurological:  Positive for headaches.     Physical Exam Triage Vital Signs ED Triage Vitals  Encounter Vitals Group     BP      Systolic BP Percentile      Diastolic BP Percentile      Pulse      Resp      Temp      Temp src  SpO2      Weight      Height      Head Circumference      Peak Flow      Pain Score      Pain Loc      Pain Education      Exclude from Growth Chart    No data found.  Updated Vital Signs BP 135/89 (BP Location: Right Arm)   Pulse 80   Temp 98.5 F (36.9 C) (Oral)   Resp 16   Ht 5\' 2"  (1.575 m)   Wt 259 lb 11.2 oz (117.8 kg)   LMP 01/16/2017   SpO2 95%   BMI 47.50 kg/m   Visual Acuity Right Eye Distance:   Left Eye Distance:   Bilateral Distance:    Right Eye Near:   Left Eye Near:    Bilateral Near:     Physical Exam Vitals and nursing note reviewed.  Constitutional:      Appearance: Normal appearance.  HENT:     Mouth/Throat:     Mouth: Mucous membranes are moist.     Pharynx: Oropharynx is clear. No oropharyngeal exudate or posterior oropharyngeal erythema.  Eyes:     General: No scleral icterus.    Extraocular Movements: Extraocular movements intact.     Conjunctiva/sclera: Conjunctivae normal.     Pupils: Pupils are equal, round, and reactive to light.  Musculoskeletal:     Cervical back: Normal range of motion and neck supple. No tenderness.  Lymphadenopathy:      Cervical: No cervical adenopathy.  Skin:    General: Skin is warm and dry.     Capillary Refill: Capillary refill takes less than 2 seconds.     Findings: No erythema or rash.  Neurological:     General: No focal deficit present.     Mental Status: She is alert and oriented to person, place, and time.      UC Treatments / Results  Labs (all labs ordered are listed, but only abnormal results are displayed) Labs Reviewed  GROUP A STREP BY PCR    EKG   Radiology No results found.  Procedures Procedures (including critical care time)  Medications Ordered in UC Medications  dexamethasone  (DECADRON ) injection 10 mg (has no administration in time range)    Initial Impression / Assessment and Plan / UC Course  I have reviewed the triage vital signs and the nursing notes.  Pertinent labs & imaging results that were available during my care of the patient were reviewed by me and considered in my medical decision making (see chart for details).   Patient is a pleasant, nontoxic-appearing 38 year old female presenting for evaluation of migraine headache and sore throat as outlined HPI above.  The sore throat started last night and it is not associated with fever, runny nose, nasal congestion, ear pain, or cough.  Oropharyngeal exam reveals no erythema, tonsillar edema or exudate.  Also no cervical lymphadenopathy is present.  I will order a strep PCR.  With regards to the patient's headache, she reports that it is in a normal migraine presentation being occipital without associated visual disturbances or aura.  She has had nausea but no vomiting.  She is currently prescribed rizatriptan and is awaiting a new patient appoint with [facility] in [place].  She has also been on Emgality and sumatriptan in the past without any improvement of symptoms.  She has taken over-the-counter ibuprofen  and Excedrin without improvement of symptoms.  Given that she  has had a failure of  triptans I told her we could try a steroid taper to break her migraine.  I will have staff administer 10 mg of IM Decadron  here in clinic and discharge her home on a prednisone  taper.  PCR is negative.  I will discharge patient on the diagnosis of migraine headache viral pharyngitis.  She may use over-the-counter Tylenol  and/or ibuprofen  as needed for her sore throat pain along with salt water gargles and over-the-counter Chloraseptic or Sucrets lozenges.  Is on taper for the migraine.   Final Clinical Impressions(s) / UC Diagnoses   Final diagnoses:  Intractable migraine without aura and without status migrainosus  Viral pharyngitis     Discharge Instructions      Starting tomorrow morning begin taking the prednisone  taper according the package instructions to help break your migraine.  You may continue to use your rizatriptan as prescribed.  To also help your migraine headache I suggest using the following supplements: Coenzyme Q 10 300 mg once daily Vitamin B2 200 to 250 mg twice daily Magnesium 400 low grams at bedtime.  Your strep test today was negative but I do believe you have a virus which is causing your symptoms.  Gargle with warm salt water 2-3 times a day to soothe your throat, aid in pain relief, and aid in healing.  Take over-the-counter ibuprofen  according to the package instructions as needed for pain.  You can also use Chloraseptic or Sucrets lozenges, 1 lozenge every 2 hours as needed for throat pain.  If you develop any new or worsening symptoms return for reevaluation.    ED Prescriptions     Medication Sig Dispense Auth. Provider   predniSONE  (STERAPRED UNI-PAK 21 TAB) 10 MG (21) TBPK tablet Take 6 tablets on day 1, 5 tablets day 2, 4 tablets day 3, 3 tablets day 4, 2 tablets day 5, 1 tablet day 6 21 tablet Kent Pear, NP      PDMP not reviewed this encounter.   Kent Pear, NP 11/24/23 [phone]

## 2023-11-24 NOTE — ED Triage Notes (Signed)
 Pt c/o migraine and sore throat. She states the migraine started Monday and the throat started today. Unsure if she has had a fever.

## 2023-11-30 ENCOUNTER — Ambulatory Visit (HOSPITAL_COMMUNITY): Payer: Self-pay

## 2023-11-30 ENCOUNTER — Ambulatory Visit
Admission: RE | Admit: 2023-11-30 | Discharge: 2023-11-30 | Disposition: A | Discharge status: Home / Self Care | Source: Ambulatory Visit

## 2023-11-30 VITALS — BP 128/85 | HR 70 | Temp 97.9°F | Resp 16

## 2023-11-30 DIAGNOSIS — R002 Palpitations: Secondary | ICD-10-CM

## 2023-11-30 DIAGNOSIS — R519 Headache, unspecified: Secondary | ICD-10-CM

## 2023-11-30 LAB — CBC WITH DIFFERENTIAL/PLATELET
Abs Immature Granulocytes: 0.1 10*3/uL — ABNORMAL HIGH (ref 0.00–0.07)
Basophils Absolute: 0 10*3/uL (ref 0.0–0.1)
Basophils Relative: 0 %
Eosinophils Absolute: 0.1 10*3/uL (ref 0.0–0.5)
Eosinophils Relative: 1 %
HCT: 42.4 % (ref 36.0–46.0)
Hemoglobin: 14.4 g/dL (ref 12.0–15.0)
Immature Granulocytes: 1 %
Lymphocytes Relative: 24 %
Lymphs Abs: 2.3 10*3/uL (ref 0.7–4.0)
MCH: 29.8 pg (ref 26.0–34.0)
MCHC: 34 g/dL (ref 30.0–36.0)
MCV: 87.8 fL (ref 80.0–100.0)
Monocytes Absolute: 0.6 10*3/uL (ref 0.1–1.0)
Monocytes Relative: 7 %
Neutro Abs: 6.4 10*3/uL (ref 1.7–7.7)
Neutrophils Relative %: 67 %
Platelets: 295 10*3/uL (ref 150–400)
RBC: 4.83 MIL/uL (ref 3.87–5.11)
RDW: 13 % (ref 11.5–15.5)
WBC: 9.5 10*3/uL (ref 4.0–10.5)
nRBC: 0 % (ref 0.0–0.2)

## 2023-11-30 LAB — COMPREHENSIVE METABOLIC PANEL WITH GFR
ALT: 26 U/L (ref 0–44)
AST: 20 U/L (ref 15–41)
Albumin: 4 g/dL (ref 3.5–5.0)
Alkaline Phosphatase: 69 U/L (ref 38–126)
Anion gap: 10 (ref 5–15)
BUN: 14 mg/dL (ref 6–20)
CO2: 27 mmol/L (ref 22–32)
Calcium: 8.9 mg/dL (ref 8.9–10.3)
Chloride: 101 mmol/L (ref 98–111)
Creatinine, Ser: 0.93 mg/dL (ref 0.44–1.00)
GFR, Estimated: >60 mL/min (ref >60)
Glucose, Bld: 98 mg/dL (ref 70–99)
Potassium: 3.8 mmol/L (ref 3.5–5.1)
Sodium: 138 mmol/L (ref 135–145)
Total Bilirubin: 0.6 mg/dL (ref 0.0–1.2)
Total Protein: 7.2 g/dL (ref 6.5–8.1)

## 2023-11-30 MED ORDER — BACLOFEN 10 MG PO TABS: 10.0000 mg | ORAL_TABLET | Freq: Three times a day (TID) | ORAL | 0 refills | Status: AC

## 2023-11-30 MED ORDER — DICLOFENAC SODIUM 75 MG PO TBEC: 75.0000 mg | DELAYED_RELEASE_TABLET | Freq: Two times a day (BID) | ORAL | 0 refills | Status: DC

## 2023-11-30 NOTE — Discharge Instructions (Addendum)
 1. Palpitations (Primary) - ED EKG completed in UC shows sinus bradycardia, otherwise normal EKG, ventricular rate of 57 bpm, no sign of STEMI. - Comprehensive metabolic panel and CBC with Differential collected in UC is sent to lab for further testing results should be available in 1 to 2 days via MyChart. -Continue to monitor symptoms for any change in severity if there is any escalation of current symptoms or development of new symptoms follow-up in ER for further evaluation and management.  2. Acute nonintractable headache, unspecified headache type & acute back pain - diclofenac (VOLTAREN) 75 MG EC tablet; Take 1 tablet (75 mg total) by mouth 2 (two) times daily.  Dispense: 30 tablet; Refill: 0 - baclofen (LIORESAL) 10 MG tablet; Take 1 tablet (10 mg total) by mouth 3 (three) times daily.  Dispense: 30 tablet; Refill: 0

## 2023-11-30 NOTE — ED Triage Notes (Signed)
 Pt presents with heart palpitations, headache and back pain x 4 days.

## 2023-11-30 NOTE — ED Provider Notes (Signed)
 [facility]  Note:  This document was prepared using Conservation officer, historic buildings and may include unintentional dictation errors.  MRN: [id number] DOB: [birth year]  Subjective:   [name] [name] is a 38 y.o. female presenting for heart palpitations, headache, back pain x 4 days.  Patient reports intermittent feeling of chest beating really hard and fast, denies prior history of anxiety or cardiac syndrome.  Patient denies any head injury or back injury recently that would contribute to symptoms.  Patient not taking any over-the-counter medication to treat symptoms.  Denies any current shortness of breath, chest pain, weakness, dizziness.  No current facility-administered medications for this encounter.  Current Outpatient Medications:    baclofen (LIORESAL) 10 MG tablet, Take 1 tablet (10 mg total) by mouth 3 (three) times daily., Disp: 30 tablet, Rfl: 0   diclofenac (VOLTAREN) 75 MG EC tablet, Take 1 tablet (75 mg total) by mouth 2 (two) times daily., Disp: 30 tablet, Rfl: 0   albuterol  (VENTOLIN  HFA) 108 (90 Base) MCG/ACT inhaler, Inhale 1-2 puffs into the lungs every 6 (six) hours as needed for wheezing or shortness of breath., Disp: 1 each, Rfl: 0   baclofen (LIORESAL) 10 MG tablet, Take 10 mg by mouth 2 (two) times daily., Disp: , Rfl:    butalbital-acetaminophen -caffeine (FIORICET) 50-325-40 MG tablet, Take by mouth., Disp: , Rfl:    cyanocobalamin (,VITAMIN B-12,) 1000 MCG/ML injection, SMARTSIG:0.1 Milliliter(s) IM Every 2 Weeks, Disp: , Rfl:    EMGALITY 120 MG/ML SOAJ, SMARTSIG:120 Milligram(s) SUB-Q Once a Month, Disp: , Rfl:    EPINEPHrine  0.3 mg/0.3 mL IJ SOAJ injection, See admin instructions., Disp: , Rfl:    famotidine  (PEPCID ) 20 MG tablet, Take by mouth., Disp: , Rfl:    ferrous sulfate 325 (65 FE) MG tablet, Take 325 mg by mouth daily with breakfast., Disp: , Rfl:    FLUoxetine (PROZAC) 20 MG capsule, Take 1 tablet by mouth daily., Disp: , Rfl:     montelukast (SINGULAIR) 10 MG tablet, Take by mouth., Disp: , Rfl:    oseltamivir  (TAMIFLU ) 75 MG capsule, Take 1 capsule (75 mg total) by mouth every 12 (twelve) hours., Disp: 10 capsule, Rfl: 0   predniSONE  (STERAPRED UNI-PAK 21 TAB) 10 MG (21) TBPK tablet, Take 6 tablets on day 1, 5 tablets day 2, 4 tablets day 3, 3 tablets day 4, 2 tablets day 5, 1 tablet day 6, Disp: 21 tablet, Rfl: 0   promethazine -dextromethorphan (PROMETHAZINE -DM) 6.25-15 MG/5ML syrup, Take 5 mLs by mouth 4 (four) times daily as needed for cough., Disp: 118 mL, Rfl: 0   Semaglutide-Weight Management 0.25 MG/0.5ML SOAJ, Inject 0.25 mg into the skin., Disp: , Rfl:    sertraline (ZOLOFT) 50 MG tablet, Take 1 tablet by mouth daily., Disp: , Rfl:    triamcinolone  (NASACORT ) 55 MCG/ACT AERO nasal inhaler, 2 sprays daily., Disp: , Rfl:    UBRELVY 100 MG TABS, TAKE 100MG  AT HEADACHE ONSET. CAN REPEAT AFTER 2 HOURS IF NEEDED. DO NOT EXCEED 200MG  IN 24 HOURS., Disp: , Rfl:    Allergies  Allergen Reactions   Azithromycin Hives, Shortness Of Breath and Rash   Fluticasone  Anaphylaxis   Fluticasone  Propionate Anaphylaxis, Shortness Of Breath and Swelling    Can use Nasonex  without problems or reaction. Can take prednisone  without reaction or problems. Facial and throat swelling Can use Nasonex  without problems or reaction. Can take prednisone  without reaction or problems.    Tiotropium     Other reaction(s): Cough Caused worsening cough  Topamax [Topiramate] Shortness Of Breath    Dizziness     Past Medical History:  Diagnosis Date   Abdominal cramping    Asthma    as a child   Headache    migraines   Heart murmur    asd murmur. saw cardiologist until age 32 and it was so small. rechecked 6 years ago and all was fine     Past Surgical History:  Procedure Laterality Date   KNEE ARTHROSCOPY Right 2004   LAPAROSCOPIC VAGINAL HYSTERECTOMY WITH SALPINGECTOMY Bilateral 09/11/2017   Procedure: LAPAROSCOPIC ASSISTED  VAGINAL HYSTERECTOMY WITH SALPINGECTOMY;  Surgeon: Schermerhorn, Joselyn Nicely, MD;  Location: [facility];  Service: Gynecology;  Laterality: Bilateral;   LAPAROSCOPY N/A 08/03/2018   Procedure: LAPAROSCOPY OPERATIVE,;  Surgeon: Carolynn Citrin, MD;  Location: [facility];  Service: Gynecology;  Laterality: N/A;   LYSIS OF ADHESION N/A 08/03/2018   Procedure: LYSIS OF ADHESION;  Surgeon: Schermerhorn, Joselyn Nicely, MD;  Location: [facility];  Service: Gynecology;  Laterality: N/A;   TONSILLECTOMY  age 51    Family History  Problem Relation Age of Onset   Cardiomyopathy Father    Migraines Mother    Breast cancer Maternal Grandmother 107    Social History   Tobacco Use   Smoking status: Former    Current packs/day: 0.00    Types: Cigarettes    Quit date: 07/27/2014    Years since quitting: 9.3    Passive exposure: Past   Smokeless tobacco: Never  Vaping Use   Vaping status: Never Used  Substance Use Topics   Alcohol use: Yes    Alcohol/week: 0.0 standard drinks of alcohol    Comment: rarely   Drug use: No    ROS Refer to HPI for ROS details.  Objective:   Vitals: BP 128/85 (BP Location: Right Arm)   Pulse 70   Temp 97.9 F (36.6 C) (Oral)   Resp 16   LMP 01/16/2017   SpO2 97%   Physical Exam Vitals and nursing note reviewed.  Constitutional:      General: She is not in acute distress.    Appearance: She is well-developed. She is not ill-appearing or toxic-appearing.  HENT:     Head: Normocephalic and atraumatic.     Mouth/Throat:     Mouth: Mucous membranes are moist.  Cardiovascular:     Rate and Rhythm: Regular rhythm. Bradycardia present.     Heart sounds: No murmur heard. Pulmonary:     Effort: Pulmonary effort is normal. No respiratory distress.     Breath sounds: No stridor. No wheezing, rhonchi or rales.  Chest:     Chest wall: No tenderness.  Musculoskeletal:     Lumbar back: Spasms and tenderness present. No swelling or bony tenderness. Normal range of  motion.  Skin:    General: Skin is warm and dry.  Neurological:     General: No focal deficit present.     Mental Status: She is alert and oriented to person, place, and time.  Psychiatric:        Mood and Affect: Mood normal.        Behavior: Behavior normal.     Procedures  Results for orders placed or performed during the hospital encounter of 11/30/23 (from the past 24 hours)  CBC with Differential     Status: Abnormal   Collection Time: 11/30/23 10:42 AM  Result Value Ref Range   WBC 9.5 4.0 - 10.5 K/uL   RBC 4.83 3.87 -  5.11 MIL/uL   Hemoglobin 14.4 12.0 - 15.0 g/dL   HCT 16.1 09.6 - 04.5 %   MCV 87.8 80.0 - 100.0 fL   MCH 29.8 26.0 - 34.0 pg   MCHC 34.0 30.0 - 36.0 g/dL   RDW 40.9 81.1 - 91.4 %   Platelets 295 150 - 400 K/uL   nRBC 0.0 0.0 - 0.2 %   Neutrophils Relative % 67 %   Neutro Abs 6.4 1.7 - 7.7 K/uL   Lymphocytes Relative 24 %   Lymphs Abs 2.3 0.7 - 4.0 K/uL   Monocytes Relative 7 %   Monocytes Absolute 0.6 0.1 - 1.0 K/uL   Eosinophils Relative 1 %   Eosinophils Absolute 0.1 0.0 - 0.5 K/uL   Basophils Relative 0 %   Basophils Absolute 0.0 0.0 - 0.1 K/uL   Immature Granulocytes 1 %   Abs Immature Granulocytes 0.10 (H) 0.00 - 0.07 K/uL    Assessment and Plan :     Discharge Instructions      1. Palpitations (Primary) - ED EKG completed in UC shows sinus bradycardia, otherwise normal EKG, ventricular rate of 57 bpm, no sign of STEMI. - Comprehensive metabolic panel and CBC with Differential collected in UC is sent to lab for further testing results should be available in 1 to 2 days via MyChart. -Continue to monitor symptoms for any change in severity if there is any escalation of current symptoms or development of new symptoms follow-up in ER for further evaluation and management.  2. Acute nonintractable headache, unspecified headache type & acute back pain - diclofenac (VOLTAREN) 75 MG EC tablet; Take 1 tablet (75 mg total) by mouth 2 (two) times  daily.  Dispense: 30 tablet; Refill: 0 - baclofen (LIORESAL) 10 MG tablet; Take 1 tablet (10 mg total) by mouth 3 (three) times daily.  Dispense: 30 tablet; Refill: 0    Margette Sheldon, Daven[place] B, NP 11/30/23 1106

## 2024-01-07 DIAGNOSIS — F909 Attention-deficit hyperactivity disorder, unspecified type: Secondary | ICD-10-CM | POA: Insufficient documentation

## 2024-02-19 ENCOUNTER — Other Ambulatory Visit: Payer: Self-pay | Admitting: Medical Genetics

## 2024-02-27 ENCOUNTER — Other Ambulatory Visit
Admission: RE | Admit: 2024-02-27 | Discharge: 2024-02-27 | Disposition: A | Discharge status: Home / Self Care | Payer: Self-pay | Source: Ambulatory Visit | Attending: Medical Genetics | Admitting: Medical Genetics

## 2024-03-08 LAB — GENECONNECT MOLECULAR SCREEN: Genetic Analysis Overall Interpretation: NEGATIVE

## 2024-04-12 ENCOUNTER — Ambulatory Visit
Admission: EM | Admit: 2024-04-12 | Discharge: 2024-04-12 | Disposition: A | Discharge status: Home / Self Care | Attending: Family Medicine | Admitting: Family Medicine

## 2024-04-12 DIAGNOSIS — S0502XA Injury of conjunctiva and corneal abrasion without foreign body, left eye, initial encounter: Secondary | ICD-10-CM | POA: Diagnosis not present

## 2024-04-12 DIAGNOSIS — H1032 Unspecified acute conjunctivitis, left eye: Secondary | ICD-10-CM | POA: Diagnosis not present

## 2024-04-12 MED ORDER — EYE WASH OP SOLN
1000.0000 [drp] | OPHTHALMIC | Status: DC | PRN
  Administered 2024-04-12: 1000 [drp] via OPHTHALMIC

## 2024-04-12 MED ORDER — BACITRACIN-POLYMYXIN B 500-10000 UNIT/GM OP OINT: TOPICAL_OINTMENT | Freq: Three times a day (TID) | OPHTHALMIC | Status: DC

## 2024-04-12 NOTE — Discharge Instructions (Addendum)
 Use the antibiotic eye ointment three times a day for 7 days.  Follow up with your primary eyecare provider or [facility] if symptoms suddenly worsen or you have little improvement in your eye symptoms.
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
## 2024-05-07 NOTE — Progress Notes (Unsigned)
 [facility] [address] [place], [place] [address]  Pulmonary Sleep Medicine   Office Visit Note  Patient Name: [name] [name] DOB: 1985/08/05 MRN [id number]    Chief Complaint: Obstructive Sleep Apnea visit  Brief History:  [name] [name] is seen today for a 3 month follow up on CPAP @ 7 cmH2O. The patient has a 4 year history of sleep apnea. Patient is not consistently using PAP nightly.  The patient feels *** after sleeping with PAP.  The patient reports *** from PAP use. Reported sleepiness is  *** and the Epworth Sleepiness Score is *** out of 24. The patient *** take naps. The patient complains of the following: ***  The compliance download shows 46% compliance with an average use time of 3 hours 22 minutes. The AHI is 0.6  The patient *** of limb movements disrupting sleep.  ROS  General: (-) fever, (-) chills, (-) night sweat Nose and Sinuses: (-) nasal stuffiness or itchiness, (-) postnasal drip, (-) nosebleeds, (-) sinus trouble. Mouth and Throat: (-) sore throat, (-) hoarseness. Neck: (-) swollen glands, (-) enlarged thyroid, (-) neck pain. Respiratory: *** cough, *** shortness of breath, *** wheezing. Neurologic: *** numbness, *** tingling. Psychiatric: *** anxiety, *** depression   Current Medication: Outpatient Encounter Medications as of 05/08/2024  Medication Sig   albuterol  (VENTOLIN  HFA) 108 (90 Base) MCG/ACT inhaler Inhale 1-2 puffs into the lungs every 6 (six) hours as needed for wheezing or shortness of breath. (Patient not taking: Reported on 04/12/2024)   amphetamine-dextroamphetamine (ADDERALL XR) 20 MG 24 hr capsule Take 20 mg by mouth every morning.   baclofen  (LIORESAL ) 10 MG tablet Take 10 mg by mouth 2 (two) times daily.   baclofen  (LIORESAL ) 10 MG tablet Take 1 tablet (10 mg total) by mouth 3 (three) times daily.   butalbital-acetaminophen -caffeine (FIORICET) 50-325-40 MG tablet Take by mouth. (Patient not taking: Reported on 04/12/2024)    cyanocobalamin (,VITAMIN B-12,) 1000 MCG/ML injection SMARTSIG:0.1 Milliliter(s) IM Every 2 Weeks (Patient not taking: Reported on 04/12/2024)   diclofenac  (VOLTAREN ) 75 MG EC tablet Take 1 tablet (75 mg total) by mouth 2 (two) times daily. (Patient not taking: Reported on 04/12/2024)   EMGALITY 120 MG/ML SOAJ SMARTSIG:120 Milligram(s) SUB-Q Once a Month (Patient not taking: Reported on 04/12/2024)   EPINEPHrine  0.3 mg/0.3 mL IJ SOAJ injection See admin instructions.   famotidine  (PEPCID ) 20 MG tablet Take by mouth.   ferrous sulfate 325 (65 FE) MG tablet Take 325 mg by mouth daily with breakfast. (Patient not taking: Reported on 04/12/2024)   FLUoxetine (PROZAC) 20 MG capsule Take 1 tablet by mouth daily.   montelukast (SINGULAIR) 10 MG tablet Take by mouth. (Patient not taking: Reported on 04/12/2024)   oseltamivir  (TAMIFLU ) 75 MG capsule Take 1 capsule (75 mg total) by mouth every 12 (twelve) hours. (Patient not taking: Reported on 04/12/2024)   predniSONE  (STERAPRED UNI-PAK 21 TAB) 10 MG (21) TBPK tablet Take 6 tablets on day 1, 5 tablets day 2, 4 tablets day 3, 3 tablets day 4, 2 tablets day 5, 1 tablet day 6 (Patient not taking: Reported on 04/12/2024)   promethazine -dextromethorphan (PROMETHAZINE -DM) 6.25-15 MG/5ML syrup Take 5 mLs by mouth 4 (four) times daily as needed for cough. (Patient not taking: Reported on 04/12/2024)   Semaglutide-Weight Management 0.25 MG/0.5ML SOAJ Inject 0.25 mg into the skin. (Patient not taking: Reported on 04/12/2024)   sertraline (ZOLOFT) 50 MG tablet Take 1 tablet by mouth daily. (Patient not taking: Reported on 04/12/2024)  triamcinolone  (NASACORT ) 55 MCG/ACT AERO nasal inhaler 2 sprays daily.   UBRELVY 100 MG TABS TAKE 100MG  AT HEADACHE ONSET. CAN REPEAT AFTER 2 HOURS IF NEEDED. DO NOT EXCEED 200MG  IN 24 HOURS. (Patient not taking: Reported on 04/12/2024)   No facility-administered encounter medications on file as of 05/08/2024.    Surgical History: Past  Surgical History:  Procedure Laterality Date   KNEE ARTHROSCOPY Right 2004   LAPAROSCOPIC VAGINAL HYSTERECTOMY WITH SALPINGECTOMY Bilateral 09/11/2017   Procedure: LAPAROSCOPIC ASSISTED VAGINAL HYSTERECTOMY WITH SALPINGECTOMY;  Surgeon: Schermerhorn, Debby PARAS, MD;  Location: [facility] ORS;  Service: Gynecology;  Laterality: Bilateral;   LAPAROSCOPY N/A 08/03/2018   Procedure: LAPAROSCOPY OPERATIVE,;  Surgeon: Lovetta Debby PARAS, MD;  Location: [facility] ORS;  Service: Gynecology;  Laterality: N/A;   LYSIS OF ADHESION N/A 08/03/2018   Procedure: LYSIS OF ADHESION;  Surgeon: Schermerhorn, Debby PARAS, MD;  Location: [facility] ORS;  Service: Gynecology;  Laterality: N/A;   TONSILLECTOMY  age 3    Medical History: Past Medical History:  Diagnosis Date   Abdominal cramping    Asthma    as a child   Headache    migraines   Heart murmur    asd murmur. saw cardiologist until age 37 and it was so small. rechecked 6 years ago and all was fine    Family History: Non contributory to the present illness  Social History: Social History   Socioeconomic History   Marital status: Single    Spouse name: Not on file   Number of children: Not on file   Years of education: Not on file   Highest education level: Not on file  Occupational History   Not on file  Tobacco Use   Smoking status: Former    Current packs/day: 0.00    Types: Cigarettes    Quit date: 07/27/2014    Years since quitting: 9.7    Passive exposure: Past   Smokeless tobacco: Never  Vaping Use   Vaping status: Never Used  Substance and Sexual Activity   Alcohol use: Yes    Alcohol/week: 0.0 standard drinks of alcohol    Comment: rarely   Drug use: No   Sexual activity: Yes  Other Topics Concern   Not on file  Social History Narrative   Not on file   Social Drivers of Health   Financial Resource Strain: Low Risk  (01/05/2024)   [facility] from [facility]   Overall Financial Resource Strain (CARDIA)    How hard is it for you  to pay for the very basics like food, housing, medical care, and heating?: Not hard at all  Food Insecurity: No Food Insecurity (01/05/2024)   Received from [facility]   Hunger Vital Sign    Within the past 12 months, you worried that your food would run out before you got the money to buy more.: Never true    Within the past 12 months, the food you bought just didn't last and you didn't have money to get more.: Never true  Transportation Needs: No Transportation Needs (01/05/2024)   [facility] from [facility]   PRAPARE - Transportation    Lack of Transportation (Medical): No    Lack of Transportation (Non-Medical): No  Physical Activity: Not on file  Stress: Not on file  Social Connections: Not on file  Intimate Partner Violence: Not At Risk (10/03/2023)   Received from [facility]   Humiliation, Afraid, Rape, and Kick questionnaire    Within the  last year, have you been afraid of your partner or ex-partner?: No    Within the last year, have you been humiliated or emotionally abused in other ways by your partner or ex-partner?: No    Within the last year, have you been kicked, hit, slapped, or otherwise physically hurt by your partner or ex-partner?: No    Within the last year, have you been raped or forced to have any kind of sexual activity by your partner or ex-partner?: No    Vital Signs: Last menstrual period 01/16/2017. There is no height or weight on file to calculate BMI.    Examination: General Appearance: The patient is well-developed, well-nourished, and in no distress. Neck Circumference: *** Skin: Gross inspection of skin unremarkable. Head: normocephalic, no gross deformities. Eyes: no gross deformities noted. ENT: ears appear grossly normal Neurologic: Alert and oriented. No involuntary movements.  STOP BANG RISK ASSESSMENT S (snore) Have you been told that you snore?     YES/NO   T (tired) Are you often tired, fatigued, or sleepy during the day?    YES/NO  O (obstruction) Do you stop breathing, choke, or gasp during sleep? YES/NO   P (pressure) Do you have or are you being treated for high blood pressure? YES/NO   B (BMI) Is your body index greater than 35 kg/m? YES/NO   A (age) Are you 70 years old or older? NO   N (neck) Do you have a neck circumference greater than 16 inches?   YES/NO   G (gender) Are you a female? NO   TOTAL STOP/BANG "YES" ANSWERS        A STOP-Bang score of 2 or less is considered low risk, and a score of 5 or more is high risk for having either moderate or severe OSA. For people who score 3 or 4, doctors may need to perform further assessment to determine how likely they are to have OSA.         EPWORTH SLEEPINESS SCALE:  Scale:  (0)= no chance of dozing; (1)= slight chance of dozing; (2)= moderate chance of dozing; (3)= high chance of dozing  Chance  Situtation    Sitting and reading: ***    Watching TV: ***    Sitting Inactive in public: ***    As a passenger in car: ***      Lying down to rest: ***    Sitting and talking: ***    Sitting quielty after lunch: ***    In a car, stopped in traffic: ***   TOTAL SCORE:   *** out of 24    SLEEP STUDIES:  HST (06/2020) REI 9/hr , Supine REI 11/hr , Min Sp02 81% HST (11/2023) REI 1.9, Min Sp02 87%.  study limited due to only 3 hours of recording.  HST (11/2023) REI 0.4 "may be a suggestion of significant supine with an estimated RDI of 11.2/hr of supine recording, however this was limited to 16 minutes)  PSG (12/2023) AHI 7.8/hr , REM AHI 30/hr , Min Sp02 84% Titration (01/2024) CPAP@ 7 cmH20   CPAP COMPLIANCE DATA:  Date Range: 02/04/2024 - 05/03/2024  Average Daily Use: 5 hours 19 minutes  Median Use: 5 hours 45 minutes  Compliance for > 4 Hours: 46% days  AHI: 0.6 respiratory events per hour  Days Used: 57/90  Mask Leak: 16.8  95th Percentile Pressure: 12.0 cmH2O         LABS: Recent Results (from the past 2160  hours)  [facility]  Molecular Screen - Blood ( Clinical Lab)     Status: None   Collection Time: 02/27/24 12:07 PM  Result Value Ref Range   Genetic Analysis Overall Interpretation Negative    Genetic Disease Assessed      This is a screening test and does not detect all pathogenic or likely pathogenic variant(s) in the tested genes; diagnostic testing is recommended for individuals with a personal or family history of heart disease or hereditary cancer. [facility] Tier One  Population Screen is a screening test that analyzes 11 genes related to hereditary breast and ovarian cancer (HBOC) syndrome, Lynch syndrome, and familial hypercholesterolemia. This test only reports clinically significant pathogenic and likely  pathogenic variants but does not report variants of uncertain significance (VUS). In addition, analysis of the PMS2 gene excludes exons 11-15, which overlap with a known pseudogene (PMS2CL).    Genetic Analysis Report      No pathogenic or likely pathogenic variants were detected in the genes analyzed by this test.Genetic test results should be interpreted in the context of an individual's personal medical and family history. Alteration to medical management is NOT  recommended based solely on this result. Clinical correlation is advised.Additional Considerations- This is a screening test; individuals may still carry pathogenic or likely pathogenic variant(s) in the tested genes that are not detected by this test.-  For individuals at risk for these or other related conditions based on factors including personal or family history, diagnostic testing is recommended.- The absence of pathogenic or likely pathogenic variant(s) in the analyzed genes, while reassuring,  does not eliminate the possibility of a hereditary condition; there are other variants and genes associated with heart disease and hereditary cancer that are not included in this test.    Genes Tested See Notes      Comment: APOB, BRCA1, BRCA2, EPCAM, LDLR, LDLRAP1, PCSK9, PMS2, MLH1, MSH2, MSH6   Disclaimer See Notes     Comment: This test was developed and validated by [facility] This test has not been cleared or approved by the [place]  Food and Drug Administration (FDA). The [facility] laboratory is accredited by the College of American Pathologists (CAP) and certified under  the Clinical Laboratory Improvement Amendments (CLIA #: [id number]) to perform high-complexity clinical tests. This test is used for clinical purposes. It should not be regarded as investigational or for research.    Sequencing Location See Notes     Comment: Sequencing done at [facility] [address], [address], [place] [address] (CLIA# [id number])   Interpretation Methods and Limitations See Notes     Comment: Extracted DNA is enriched for targeted regions and then sequenced using the [facility] Exome+ (R) assay on an Illumina DNA sequencing system. Data is then aligned to a modified version of GRCh38 and all genes are analyzed using the MANE transcript and MANE  Plus Clinical transcript, when available. Small variant calling is completed using a customized version of [employer]'s DNAseq software, augmented by a proprietary small variant caller for difficult variants. Copy number variants (CNVs) are then called  using a proprietary bioinformatics pipeline based on depth analysis with a comparison to similarly sequenced samples. Analysis of the PMS2 gene is limited to exons 1-10. The interpretation and reporting of variants in APOB, PCSK9, and LDLR is specific to  familial hypercholesterolemia; variants associated with hypobetalipoproteinemia are not included. Interpretation is based upon guidelines published by the [employer] and Gen[employer]), the Association for [employer] Pathology  (  AMP) or their modification by [employer] Variant Curation Expert Panels when available and/or review of previous  clinical assertions available in the [employer]. Interpretation is limited to the transcripts indicated on the report and +/- 10 bp into  intronic regions, except as noted below. [facility] variant classifications include pathogenic, likely pathogenic, variant of uncertain significance (VUS), likely benign, and benign. Only variants classified as pathogenic and likely pathogenic are included in  the report. All reported variants are confirmed through secondary manual inspection of DNA sequence data or orthogonal testing. Risk estimations and management guidelines included in this report are based on analysis of primary literature and  recommendations of applicable professional societies, and should be regarded as approximations.Based on validation studies, this assay delivers > 99% sensitivity and specificity for single nucleotide variants and insertions  and deletions (indels) up to  20 bp. Larger indels and complex variants are also reported but sensitivity may be reduced. Based on validation studies, this assay delivers > 99% sensitivity to multi-exon CNVs and > 90% sensitivity to single-exon CNVs. This test may not detect variants  in challenging regions (such as short tandem repeats, homopolymer runs, and segment duplications), sub-exonic CNVs, chromosomal aneuploidy, or variants in the presence of mosaicism. Phasing will be attempted and reported, when possible. Structural  rearrangements such as inversions, translocations, and gene conversions are not tested in this assay unless explicitly indicated. Additionally, deep intronic, promoter, and enhancer regions may not be covered. It is important to note that this is a  screening test and cannot detect all disease-causing variants. A negative result does not guarantee the absence of a rare, undetectable variant in the genes analyzed; consider using a diagnostic test if there i s significant personal and/or family history  of one of the conditions  analyzed by this test. Any potential incidental findings outside of these genes and conditions will not be identified, nor reported. The results of a genetic test may be influenced by various factors, including bone marrow  transplantation, blood transfusions, or in rare cases, hematolymphoid neoplasms.Gene Specific Notes:APOB: analysis is limited to c.10580G>A and c.10579C>T; BRCA1: sequencing analysis extends to CDS +/-20 bp; BRCA2: sequencing analysis extends to CDS  +/-20 bp. EPCAM: analysis is limited to CNVof exons 8-9; LDLR: analysis includes CNV ofthe promoter; MLH1: analysis includes CNV of the promoter; PMS2: analysis is limited to exons 1-10.Donnice JINNY Kemp, PhD, [email or web address]     Radiology: No results found.  No results found.  No results found.    Assessment and Plan: Patient Active Problem List   Diagnosis Date Noted   OSA (obstructive sleep apnea) 08/28/2023   Morbid obesity (HCC) 08/28/2023   Elevated serum creatinine 08/11/2021   Obsessive-compulsive disorder 08/11/2021   CPAP use counseling 11/30/2020   Gastroesophageal reflux disease without esophagitis 11/04/2020   Excessive daytime sleepiness 08/11/2020   Rebound headache 08/11/2020   Upper back pain, chronic 02/24/2020   Mild obstructive sleep apnea 12/31/2019   Atypical chest pain 12/30/2019   History of depression 09/05/2019   History of Lyme disease 09/05/2019   Seasonal allergic rhinitis 09/05/2019   Asthma without status asthmaticus 06/19/2018   Cardiac murmur 06/19/2018   Dyspareunia, female 06/19/2018   Exertional dyspnea 06/19/2018   Former tobacco use 06/19/2018   IBS (irritable bowel syndrome) 06/19/2018   Knee pain 06/19/2018   Pelvic pain in female 06/19/2018   Migraine 06/19/2018   Endometriosis of pelvis 09/27/2017   Postoperative state 09/11/2017   Radiculopathy of lumbar region 08/31/2016   Anxiety 12/05/2013  Vitamin B12 deficiency 12/05/2013   Depression 12/05/2013    Neck pain 12/05/2013   BMI 40.0-44.9, adult (HCC) 12/05/2013      The patient *** tolerate PAP and reports *** benefit from PAP use. The patient was reminded how to *** and advised to ***. The patient was also counselled on ***. The compliance is ***. The AHI is ***.   ***  General Counseling: I have discussed the findings of the evaluation and examination with [name] [name].  I have also discussed any further diagnostic evaluation thatmay be needed or ordered today. [name] [name] verbalizes understanding of the findings of todays visit. We also reviewed her medications today and discussed drug interactions and side effects including but not limited excessive drowsiness and altered mental states. We also discussed that there is always a risk not just to her but also people around her. she has been encouraged to call the office with any questions or concerns that should arise related to todays visit.  No orders of the defined types were placed in this encounter.       I have personally obtained a history, examined the patient, evaluated laboratory and imaging results, formulated the assessment and plan and placed orders.  Elfreda DELENA Bathe, MD [facility] Diplomate ABMS Pulmonary Critical Care Medicine and Sleep Medicine

## 2024-05-08 ENCOUNTER — Ambulatory Visit: Admitting: Internal Medicine

## 2024-05-08 VITALS — BP 128/89 | HR 82 | Resp 16 | Ht 62.0 in | Wt 232.0 lb

## 2024-05-08 DIAGNOSIS — Z6841 Body Mass Index (BMI) 40.0 and over, adult: Secondary | ICD-10-CM | POA: Diagnosis not present

## 2024-05-08 DIAGNOSIS — G4733 Obstructive sleep apnea (adult) (pediatric): Secondary | ICD-10-CM | POA: Diagnosis not present

## 2024-05-08 DIAGNOSIS — Z7189 Other specified counseling: Secondary | ICD-10-CM

## 2024-05-08 NOTE — Patient Instructions (Signed)

## 2024-07-08 NOTE — Progress Notes (Signed)
 [facility] [address] [place], [place] [address]  Pulmonary Sleep Medicine   Office Visit Note  Patient Name: [name] [name] DOB: 21-Apr-1986 MRN [id number]    Chief Complaint: Obstructive Sleep Apnea visit  Brief History:  [name] [name] is seen today for a 2 month follow up on CPAP@ 7 cmH20. The patient has a 4 year history of sleep apnea. Patient is not using PAP nightly.  The patient feels rested after sleeping with PAP.  The patient reports benefit from PAP use. Reported sleepiness is slightly improved and the Epworth Sleepiness Score is 13 out of 24. The patient does not take naps. The patient complains of the following: exhalation value on mask blows air onto partner, not too bothersome so declined mask fitting for now. Patient reports she will take off mask in her sleep and that is why usage over 4 hours is low. The compliance download shows 40% compliance with an average use time of 3 hours 7 minutes. The AHI is 0.7  The patient does not complain of limb movements disrupting sleep.  ROS  General: (-) fever, (-) chills, (-) night sweat Nose and Sinuses: (-) nasal stuffiness or itchiness, (-) postnasal drip, (-) nosebleeds, (-) sinus trouble. Mouth and Throat: (-) sore throat, (-) hoarseness. Neck: (-) swollen glands, (-) enlarged thyroid, (-) neck pain. Respiratory: - cough, - shortness of breath, - wheezing. Neurologic: - numbness, - tingling. Psychiatric: +anxiety, + depression   Current Medication: Outpatient Encounter Medications as of 07/09/2024  Medication Sig   amphetamine-dextroamphetamine (ADDERALL XR) 20 MG 24 hr capsule Take 20 mg by mouth every morning.   baclofen  (LIORESAL ) 10 MG tablet Take 1 tablet (10 mg total) by mouth 3 (three) times daily.   EPINEPHrine  0.3 mg/0.3 mL IJ SOAJ injection See admin instructions.   omeprazole (PRILOSEC) 20 MG capsule Take 20 mg by mouth daily.   sertraline (ZOLOFT) 100 MG tablet Take 100 mg by mouth daily.    No facility-administered encounter medications on file as of 07/09/2024.    Surgical History: Past Surgical History:  Procedure Laterality Date   KNEE ARTHROSCOPY Right 2004   LAPAROSCOPIC VAGINAL HYSTERECTOMY WITH SALPINGECTOMY Bilateral 09/11/2017   Procedure: LAPAROSCOPIC ASSISTED VAGINAL HYSTERECTOMY WITH SALPINGECTOMY;  Surgeon: Schermerhorn, Debby PARAS, MD;  Location: [facility] ORS;  Service: Gynecology;  Laterality: Bilateral;   LAPAROSCOPY N/A 08/03/2018   Procedure: LAPAROSCOPY OPERATIVE,;  Surgeon: Lovetta Debby PARAS, MD;  Location: [facility] ORS;  Service: Gynecology;  Laterality: N/A;   LYSIS OF ADHESION N/A 08/03/2018   Procedure: LYSIS OF ADHESION;  Surgeon: Schermerhorn, Debby PARAS, MD;  Location: [facility] ORS;  Service: Gynecology;  Laterality: N/A;   TONSILLECTOMY  age 50    Medical History: Past Medical History:  Diagnosis Date   Abdominal cramping    Asthma    as a child   Headache    migraines   Heart murmur    asd murmur. saw cardiologist until age 38 and it was so small. rechecked 6 years ago and all was fine    Family History: Non contributory to the present illness  Social History: Social History   Socioeconomic History   Marital status: Single    Spouse name: Not on file   Number of children: Not on file   Years of education: Not on file   Highest education level: Not on file  Occupational History   Not on file  Tobacco Use   Smoking status: Former    Current packs/day: 0.00  Types: Cigarettes    Quit date: 07/27/2014    Years since quitting: 9.9    Passive exposure: Past   Smokeless tobacco: Never  Vaping Use   Vaping status: Never Used  Substance and Sexual Activity   Alcohol use: Yes    Alcohol/week: 0.0 standard drinks of alcohol    Comment: rarely   Drug use: No   Sexual activity: Yes  Other Topics Concern   Not on file  Social History Narrative   Not on file   Social Drivers of Health   Tobacco Use: Medium Risk (06/19/2024)    Received from [facility]   Patient History    Smoking Tobacco Use: Former    Smokeless Tobacco Use: Never    Passive Exposure: Past  Physicist, Medical Strain: Low Risk (01/05/2024)   Received from [facility]   Overall Financial Resource Strain (CARDIA)    How hard is it for you to pay for the very basics like food, housing, medical care, and heating?: Not hard at all  Food Insecurity: No Food Insecurity (01/05/2024)   Received from [facility]   Epic    Within the past 12 months, you worried that your food would run out before you got the money to buy more.: Never true    Within the past 12 months, the food you bought just didn't last and you didn't have money to get more.: Never true  Transportation Needs: No Transportation Needs (01/05/2024)   Received from [facility]   PRAPARE - Transportation    Lack of Transportation (Medical): No    Lack of Transportation (Non-Medical): No  Physical Activity: Not on file  Stress: Not on file  Social Connections: Not on file  Intimate Partner Violence: Not At Risk (10/03/2023)   Received from [facility]   Epic    Within the last year, have you been afraid of your partner or ex-partner?: No    Within the last year, have you been humiliated or emotionally abused in other ways by your partner or ex-partner?: No    Within the last year, have you been kicked, hit, slapped, or otherwise physically hurt by your partner or ex-partner?: No    Within the last year, have you been raped or forced to have any kind of sexual activity by your partner or ex-partner?: No  Depression (PHQ2-9): Not on file  Alcohol Screen: Not on file  Housing: Unknown (08/09/2023)   Received from [facility]   Epic    Unable to Pay for Housing in the Last Year: Not on file    Number of Times Moved in the Last Year: Not on file    At any time in the past 12 months, were you homeless or living in a shelter (including now)?: No  Utilities:  Low Risk (01/05/2024)   Received from [facility]   Utilities    Within the past 12 months, have you been unable to get utilities(heat, electricity) when it was really needed?: No  Health Literacy: Not on file    Vital Signs: Last menstrual period 01/16/2017. There is no height or weight on file to calculate BMI.    Examination: General Appearance: The patient is well-developed, well-nourished, and in no distress. Neck Circumference: 37 cm Skin: Gross inspection of skin unremarkable. Head: normocephalic, no gross deformities. Eyes: no gross deformities noted. ENT: ears appear grossly normal Neurologic: Alert and oriented. No involuntary movements.  STOP BANG RISK ASSESSMENT  S (snore) Have you been told that you snore?     NO   T (tired) Are you often tired, fatigued, or sleepy during the day?   YES  O (obstruction) Do you stop breathing, choke, or gasp during sleep? NO   P (pressure) Do you have or are you being treated for high blood pressure? NO   B (BMI) Is your body index greater than 35 kg/m? YES   A (age) Are you 2 years old or older? NO   N (neck) Do you have a neck circumference greater than 16 inches?   NO   G (gender) Are you a female? NO   TOTAL STOP/BANG YES ANSWERS 2       A STOP-Bang score of 2 or less is considered low risk, and a score of 5 or more is high risk for having either moderate or severe OSA. For people who score 3 or 4, doctors may need to perform further assessment to determine how likely they are to have OSA.         EPWORTH SLEEPINESS SCALE:  Scale:  (0)= no chance of dozing; (1)= slight chance of dozing; (2)= moderate chance of dozing; (3)= high chance of dozing  Chance  Situtation    Sitting and reading: 3    Watching TV: 2    Sitting Inactive in public: 1    As a passenger in car: 3      Lying down to rest: 3    Sitting and talking: 0    Sitting quielty after lunch: 1    In a car, stopped in traffic: 0   TOTAL  SCORE:   13 out of 24    SLEEP STUDIES:  HST (06/2020) REI 9/hr, Supine REI 11/hr, Min Sp02 81% HST (11/2023) REI 1.9, Min Sp02 87%, study limited to 3 hrs of recording. HST (11/2023) REI 0.4 may be a suggestion of significant supine with an estimated RDI of 11.2/hr of supine recording, however this was limited to 16 minutes)  PSG (12/2023) AHI 7.8/hr, REM AHI 30/hr, Min Sp02 84% Titration (01/2024) CPAP@ 7 cmH20   CPAP COMPLIANCE DATA:  Date Range: 05/08/2024 - 07/06/2024  Average Daily Use: 4 hours 4 minutes  Median Use: 4 hours 32 minutes  Compliance for > 4 Hours: 40% days  AHI: 0.7 respiratory events per hour  Days Used: 46/60  Mask Leak: 11.2  95th Percentile Pressure: 7 cmH20         LABS: No results found for this or any previous visit (from the past 2160 hours).  Radiology: No results found.  No results found.  No results found.    Assessment and Plan: Patient Active Problem List   Diagnosis Date Noted   Attention deficit hyperactivity disorder (ADHD) 01/07/2024   OSA (obstructive sleep apnea) 08/28/2023   Morbid obesity (HCC) 08/28/2023   Generalized anxiety disorder 04/20/2023   Recurrent major depressive disorder, in partial remission 04/15/2022   Elevated serum creatinine 08/11/2021   Obsessive-compulsive disorder 08/11/2021   CPAP use counseling 11/30/2020   Gastroesophageal reflux disease without esophagitis 11/04/2020   Excessive daytime sleepiness 08/11/2020   Rebound headache 08/11/2020   Upper back pain, chronic 02/24/2020   Mild obstructive sleep apnea 12/31/2019   Atypical chest pain 12/30/2019   History of depression 09/05/2019   History of Lyme disease 09/05/2019   Seasonal allergic rhinitis 09/05/2019   Asthma without status asthmaticus 06/19/2018   Cardiac murmur 06/19/2018   Dyspareunia, female 06/19/2018  Exertional dyspnea 06/19/2018   Former tobacco use 06/19/2018   IBS (irritable bowel syndrome) 06/19/2018    Knee pain 06/19/2018   Pelvic pain in female 06/19/2018   Migraine 06/19/2018   Endometriosis of pelvis 09/27/2017   Postoperative state 09/11/2017   Radiculopathy of lumbar region 08/31/2016   Anxiety 12/05/2013   Vitamin B12 deficiency 12/05/2013   Depression 12/05/2013   Neck pain 12/05/2013   BMI 40.0-44.9, adult (HCC) 12/05/2013   1. OSA (obstructive sleep apnea) (Primary) The patient does tolerate PAP and reports  benefit from PAP use. She has struggled some but feels the benefit and wishes to continue use and working on increasing her compliance. The patient was reminded how to clean equipment and advised to replace supplies routinely. The patient was also counselled on weight loss. The compliance is poor. The AHI is 0.7.   OSA on cpap- controlled.  Increase compliance with pap. CPAP continues to be medically necessary to treat this patient's OSA. F/u one month  2. CPAP use counseling CPAP Counseling: had a lengthy discussion with the patient regarding the importance of PAP therapy in management of the sleep apnea. Patient appears to understand the risk factor reduction and also understands the risks associated with untreated sleep apnea. Patient will try to make a good faith effort to remain compliant with therapy. Also instructed the patient on proper cleaning of the device including the water must be changed daily if possible and use of distilled water is preferred. Patient understands that the machine should be regularly cleaned with appropriate recommended cleaning solutions that do not damage the PAP machine for example given white vinegar and water rinses. Other methods such as ozone treatment may not be as good as these simple methods to achieve cleaning.   3. Morbid obesity with BMI of 40.0-44.9, adult (HCC) Obesity Counseling: Had a lengthy discussion regarding patients BMI and weight issues. Patient was instructed on portion control as well as increased activity. Also  discussed caloric restrictions with trying to maintain intake less than 2000 Kcal. Discussions were made in accordance with the 5As of weight management. Simple actions such as not eating late and if able to, taking a walk is suggested.      General Counseling: I have discussed the findings of the evaluation and examination with Adasyn.  I have also discussed any further diagnostic evaluation thatmay be needed or ordered today. Jackelynn verbalizes understanding of the findings of todays visit. We also reviewed her medications today and discussed drug interactions and side effects including but not limited excessive drowsiness and altered mental states. We also discussed that there is always a risk not just to her but also people around her. she has been encouraged to call the office with any questions or concerns that should arise related to todays visit.  No orders of the defined types were placed in this encounter.       I have personally obtained a history, examined the patient, evaluated laboratory and imaging results, formulated the assessment and plan and placed orders. This patient was seen today by Lauraine Lay, PA-C in collaboration with Dr. Elfreda Bathe.   Elfreda DELENA Bathe, MD [facility] Diplomate ABMS Pulmonary Critical Care Medicine and Sleep Medicine

## 2024-07-09 ENCOUNTER — Ambulatory Visit: Admitting: Internal Medicine

## 2024-07-09 VITALS — BP 126/93 | HR 88 | Resp 16 | Ht 62.0 in | Wt 233.4 lb

## 2024-07-09 DIAGNOSIS — Z6841 Body Mass Index (BMI) 40.0 and over, adult: Secondary | ICD-10-CM | POA: Diagnosis not present

## 2024-07-09 DIAGNOSIS — G4733 Obstructive sleep apnea (adult) (pediatric): Secondary | ICD-10-CM | POA: Diagnosis not present

## 2024-07-09 DIAGNOSIS — Z7189 Other specified counseling: Secondary | ICD-10-CM | POA: Diagnosis not present

## 2024-07-09 NOTE — Patient Instructions (Signed)

## 2024-08-19 ENCOUNTER — Ambulatory Visit (INDEPENDENT_AMBULATORY_CARE_PROVIDER_SITE_OTHER)
